# Patient Record
Sex: Female | Born: 1970
Health system: Southern US, Community
[De-identification: ages and names within clinical notes are randomized; demographics above are authoritative.]

## PROBLEM LIST (undated history)

## (undated) DIAGNOSIS — I10 Essential (primary) hypertension: Secondary | ICD-10-CM

## (undated) DIAGNOSIS — E119 Type 2 diabetes mellitus without complications: Secondary | ICD-10-CM

## (undated) HISTORY — PX: RHINOPLASTY: SUR1284

---

## 2001-01-10 ENCOUNTER — Other Ambulatory Visit: Admission: RE | Admit: 2001-01-10 | Discharge: 2001-01-10 | Payer: Self-pay | Admitting: Internal Medicine

## 2001-01-22 ENCOUNTER — Encounter: Payer: Self-pay | Admitting: Obstetrics and Gynecology

## 2001-01-22 ENCOUNTER — Ambulatory Visit (HOSPITAL_COMMUNITY): Admission: RE | Admit: 2001-01-22 | Discharge: 2001-01-22 | Payer: Self-pay | Admitting: Obstetrics and Gynecology

## 2001-05-22 ENCOUNTER — Encounter: Payer: Self-pay | Admitting: Obstetrics and Gynecology

## 2001-05-22 ENCOUNTER — Ambulatory Visit (HOSPITAL_COMMUNITY): Admission: RE | Admit: 2001-05-22 | Discharge: 2001-05-22 | Payer: Self-pay | Admitting: Obstetrics and Gynecology

## 2001-06-19 ENCOUNTER — Inpatient Hospital Stay (HOSPITAL_COMMUNITY): Admission: AD | Admit: 2001-06-19 | Discharge: 2001-06-21 | Payer: Self-pay | Admitting: Obstetrics and Gynecology

## 2002-01-29 ENCOUNTER — Other Ambulatory Visit: Admission: RE | Admit: 2002-01-29 | Discharge: 2002-01-29 | Payer: Self-pay | Admitting: Obstetrics and Gynecology

## 2002-07-16 ENCOUNTER — Encounter: Admission: RE | Admit: 2002-07-16 | Discharge: 2002-07-16 | Payer: Self-pay | Admitting: Specialist

## 2002-07-16 ENCOUNTER — Encounter: Payer: Self-pay | Admitting: Specialist

## 2003-02-17 ENCOUNTER — Other Ambulatory Visit: Admission: RE | Admit: 2003-02-17 | Discharge: 2003-02-17 | Payer: Self-pay | Admitting: Obstetrics and Gynecology

## 2004-03-01 ENCOUNTER — Other Ambulatory Visit: Admission: RE | Admit: 2004-03-01 | Discharge: 2004-03-01 | Payer: Self-pay | Admitting: Obstetrics and Gynecology

## 2004-03-04 ENCOUNTER — Other Ambulatory Visit: Admission: RE | Admit: 2004-03-04 | Discharge: 2004-03-04 | Payer: Self-pay | Admitting: Obstetrics and Gynecology

## 2008-12-03 ENCOUNTER — Inpatient Hospital Stay (HOSPITAL_COMMUNITY): Admission: EM | Admit: 2008-12-03 | Discharge: 2008-12-04 | Payer: Self-pay | Admitting: Emergency Medicine

## 2008-12-05 ENCOUNTER — Emergency Department (HOSPITAL_COMMUNITY): Admission: EM | Admit: 2008-12-05 | Discharge: 2008-12-05 | Payer: Self-pay | Admitting: Emergency Medicine

## 2009-01-05 ENCOUNTER — Encounter (INDEPENDENT_AMBULATORY_CARE_PROVIDER_SITE_OTHER): Payer: Self-pay | Admitting: *Deleted

## 2010-08-01 LAB — CK TOTAL AND CKMB (NOT AT ARMC)
CK, MB: 1 ng/mL (ref 0.3–4.0)
Relative Index: 0.7 (ref 0.0–2.5)

## 2010-08-01 LAB — URINALYSIS, ROUTINE W REFLEX MICROSCOPIC
Bilirubin Urine: NEGATIVE
Glucose, UA: >1000 mg/dL — AB
Hgb urine dipstick: NEGATIVE
Ketones, ur: NEGATIVE mg/dL
Leukocytes, UA: NEGATIVE
Nitrite: NEGATIVE
Protein, ur: NEGATIVE mg/dL
Specific Gravity, Urine: 1.014 (ref 1.005–1.030)
Urobilinogen, UA: 0.2 mg/dL (ref 0.0–1.0)
pH: 5 (ref 5.0–8.0)

## 2010-08-01 LAB — DIFFERENTIAL
Basophils Absolute: 0.1 10*3/uL (ref 0.0–0.1)
Basophils Relative: 0 % (ref 0–1)
Eosinophils Absolute: 0 10*3/uL (ref 0.0–0.7)
Eosinophils Relative: 0 % (ref 0–5)
Monocytes Absolute: 0.2 10*3/uL (ref 0.1–1.0)

## 2010-08-01 LAB — RAPID URINE DRUG SCREEN, HOSP PERFORMED
Amphetamines: NOT DETECTED
Barbiturates: NOT DETECTED
Benzodiazepines: NOT DETECTED
Cocaine: NOT DETECTED
Opiates: NOT DETECTED
Tetrahydrocannabinol: NOT DETECTED

## 2010-08-01 LAB — COMPREHENSIVE METABOLIC PANEL
AST: 32 U/L (ref 0–37)
Albumin: 4.1 g/dL (ref 3.5–5.2)
Alkaline Phosphatase: 51 U/L (ref 39–117)
Chloride: 105 mEq/L (ref 96–112)
GFR calc Af Amer: >60 mL/min (ref >60)
Potassium: 3.5 mEq/L (ref 3.5–5.1)
Total Bilirubin: 0.6 mg/dL (ref 0.3–1.2)

## 2010-08-01 LAB — CARDIAC PANEL(CRET KIN+CKTOT+MB+TROPI)
Relative Index: 0.4 (ref 0.0–2.5)
Total CK: 235 U/L — ABNORMAL HIGH (ref 7–177)
Troponin I: 0.01 ng/mL (ref 0.00–0.06)

## 2010-08-01 LAB — URINE MICROSCOPIC-ADD ON

## 2010-08-01 LAB — GLUCOSE, CAPILLARY
Glucose-Capillary: 109 mg/dL — ABNORMAL HIGH (ref 70–99)
Glucose-Capillary: 122 mg/dL — ABNORMAL HIGH (ref 70–99)
Glucose-Capillary: 215 mg/dL — ABNORMAL HIGH (ref 70–99)
Glucose-Capillary: 86 mg/dL (ref 70–99)

## 2010-08-01 LAB — BASIC METABOLIC PANEL
BUN: 15 mg/dL (ref 6–23)
Calcium: 8.1 mg/dL — ABNORMAL LOW (ref 8.4–10.5)
Creatinine, Ser: 0.54 mg/dL (ref 0.4–1.2)
GFR calc non Af Amer: >60 mL/min (ref >60)
Potassium: 3.5 mEq/L (ref 3.5–5.1)

## 2010-08-01 LAB — CBC
HCT: 38.3 % (ref 36.0–46.0)
Platelets: 197 10*3/uL (ref 150–400)
RBC: 3.86 MIL/uL — ABNORMAL LOW (ref 3.87–5.11)
WBC: 14.4 10*3/uL — ABNORMAL HIGH (ref 4.0–10.5)

## 2010-08-01 LAB — LIPID PANEL
Triglycerides: 50 mg/dL (ref <150)
VLDL: 10 mg/dL (ref 0–40)

## 2010-08-01 LAB — POCT CARDIAC MARKERS
CKMB, poc: <1 ng/mL — ABNORMAL LOW (ref 1.0–8.0)
Troponin i, poc: <0.05 ng/mL (ref 0.00–0.09)

## 2010-09-07 NOTE — Discharge Summary (Signed)
NAME:  NOYA, SANTARELLI NO.:  [id number]   MEDICAL RECORD NO.:  [id number]          PATIENT TYPE:  INP   LOCATION:  1423                         FACILITY:  [facility]   PHYSICIAN:  Ladell Pier, M.D.   DATE OF BIRTH:  1971/03/23   DATE OF ADMISSION:  12/03/2008  DATE OF DISCHARGE:  12/04/2008                               DISCHARGE SUMMARY   DISCHARGE DIAGNOSES:  1. Chest pain.  2. Tachycardia.  3. Diabetes.   DISCHARGE MEDICATIONS:  Metformin XL 500 mg daily.   FOLLOW-UP APPOINTMENTS:  The patient to follow up with one of our  cardiologists for a stress test.  They will call her with the  appointment.  The patient also given the number to follow up with Dr.  Lovell Sheehan, primary care physician, at  [phone].  The patient does not  have a primary care doctor.   PROCEDURE:  None.   DIAGNOSTIC STUDIES:  Chest X-Ray:  Showed no active disease.   CONSULTANTS:  Cardiology:  She was scheduled for outpatient test with  cardiology, no inpatient consult done.   HISTORY OF PRESENT ILLNESS:  The patient is a 40 year old female who  presented to the emergency room with chest pain that has been  intermittent for the past three days.  It was located in the mid-chest  and radiated to her back.  She also reported associated shortness of  breath with the chest pain.  Please see admission note for the remainder  of the history.   PAST MEDICAL HISTORY/FAMILY HISTORY/SOCIAL  HISTORY/MEDICATIONS/ALLERGIES/REVIEW OF SYSTEMS:  Per the admission  history and physical.   PHYSICAL EXAMINATION ON DISCHARGE:  VITAL SIGNS:  Temperature 97.7,  pulse of 61, respirations 16, blood pressure 109/73, pulse ox 99% on  room air.  GENERAL:  The patient is lying in bed.  Does not seem to be in any acute  distress.  HEENT: Normocephalic, atraumatic.  Pupils reactive to light without  erythema.  CARDIOVASCULAR:  Regular rhythm.  LUNGS:  Clear bilaterally.  ABDOMEN:  Positive bowel sounds.  EXTREMITIES:  No edema.   HOSPITAL COURSE:  #1 - Chest pain: - The patient was admitted to the  hospital.  She was placed on telemetry.  No events during her stay.  She  did have a chest x-ray and a D-dimer and cardiac markers done that were  all normal.  She is scheduled for a stress test with [facility] cardiology  outpatient.  She is also scheduled to follow up with Dr. Lovell Sheehan for  routine follow-up appointment.  #2 -  Diabetes:  The patient stated that she was diagnosed with diabetes  and was prescribed metformin, but she was not taking it.  She was given  a prescription for metformin.  Her blood sugars inpatient have been  running fairly routine, but it was elevated on admission.  #3 - Tachycardia:  When the patient came in she was tachycardiac.  Not  sure if her symptoms are related to just anxiety.  But her pulse today  is normal and 61 to 63.  Thyroid function is  normal.   DISCHARGE LABORATORY DATA:  Sodium 140, potassium to 3.5, chloride 112,  CO2 of 25, glucose 101, BUN 50, creatinine 0.54, calcium 8.1.  Cardiac  markers:  CK 199, MB 0.8 to 0.01.  TSH 1.48, total cholesterol 146, HDL  50, LDL 80, D-dimer less than 9.52. AST 32, ALT 18, alkaline phosphatase  51.      Ladell Pier, M.D.  Electronically Signed     NJ/MEDQ  D:  12/04/2008  T:  12/04/2008  Job:  [id number]   cc:   [facility]

## 2010-09-07 NOTE — H&P (Signed)
NAME:  Maria [name], Maria [name] NO.:  [id number]   MEDICAL RECORD NO.:  [id number]          PATIENT TYPE:  INP   LOCATION:  0101                         [facility]ACILITY:  [facility]   PHYSICIAN:  Della Goo, M.D. DATE OF BIRTH:  [birth year]   DATE OF ADMISSION:  12/03/2008  DATE OF DISCHARGE:                              HISTORY & PHYSICAL   PRIMARY CARE PHYSICIAN:  None.   CHIEF COMPLAINT:  Chest pain.   HISTORY OF PRESENT ILLNESS:  This is a 40 year old female who presents  to the emergency department with complaints of severe chest pain which  has been intermittent occurring for the past 3 days.  She states that  the pain is located in the midchest area and radiates into the back.  She also reports having associated symptoms of shortness of breath.  Denies having any nausea, vomiting, diaphoresis, but does report having  palpitations.  She reports beginning to have tightness in her hands this  evening.  She also reports having shortness of breath.  She denies  having any congestion, fevers, chills.  The patient also reports having  increased thirst which she has had for months and also increased  urination.   In the emergency department, the patient was evaluated and her  laboratory studies returned revealing hyperglycemia.  Also she was found  to have tachycardia and her EKG revealed a sinus tachycardia with a rate  ranging from 128-130.  The patient also had a urinalysis performed which  did reveal glycosuria.  Glucose level in the urine was greater than  1000.  Urine ketones were negative.  When asked specifically about  symptoms of polydipsia and polyuria, the patient reports having this for  the past few months.  She also states that she had been seen at an  urgent care center and had been placed on a medication for elevated  blood sugar.  However, she chose not to take this medication.   PAST MEDICAL HISTORY:  Otherwise none.   PAST SURGICAL HISTORY:  None.   MEDICATIONS:  None.   ALLERGIES:  No known drug allergies.   SOCIAL HISTORY:  The patient is a nonsmoker, nondrinker.  No history of  illicit drug usage.   FAMILY HISTORY:  Positive for coronary artery disease in her mother  positive for hypertension in both parents.  Positive cerebrovascular  accident in her father.  He died of a stroke.  No history of diabetes or  cancer in the family that she knows of.   REVIEW OF SYSTEMS:  Pertinents mentioned above.   PHYSICAL EXAMINATION:  FINDINGS:  This is a 40 year old thin, well-  nourished, well-developed female in mild distress.  VITAL SIGNS: Temperature 98.0, blood pressure 124/86, heart rate 131,  respirations 18, O2 saturation 99% in room air.  HEENT EXAMINATION: Normocephalic, atraumatic.  Pupils equally round,  reactive to light.  Extraocular movements are intact. Funduscopic  benign.  There is no scleral icterus.  Nares are patent bilaterally.  Oropharynx is clear.  NECK:  Supple, full range of motion.  No thyromegaly, adenopathy or  jugular  venous distention.  CARDIOVASCULAR:  Tachycardiac rate and rhythm.  No murmurs, gallops or  rubs.  LUNGS: Clear to auscultation bilaterally.  ABDOMEN: Positive bowel sounds, soft, nontender, nondistended.  EXTREMITIES: Without cyanosis, clubbing or edema.  NEUROLOGIC EXAMINATION:  The patient is alert and oriented x3.  There  are no focal deficits on examination.   LABORATORY STUDIES:  White blood cell count 14.4, hemoglobin 12.4,  hematocrit 38.3, platelets 197, neutrophils 92%, lymphocytes 6%.  Sodium  134, potassium 3.5, chloride 105, carbon dioxide 21, BUN 10, creatinine  0.75, glucose 210.  Calcium level 9.5, AST 32, ALT 18.  Urinalysis as  mentioned above with urine glucose greater than 1000.  Urine ketones  negative.  Leukocyte esterase negative, urine nitrites negative, urine  drug screen negative.  Urine HCG negative.  Point of care cardiac  markers with a myoglobin of 251, CK-MB  less than 1.0 and troponin less  than 0.05, D-dimer less than 0.22.  EKG reveals a sinus tachycardia.   ASSESSMENT:  A 40 year old female being admitted with:  1. Chest pain number  2. Sinus tachycardia.  3. Hyperglycemia.  4. Anxiety.   PLAN:  The patient will be admitted to telemetry area for cardiac  monitoring.  Cardiac enzymes will be performed.  The patient has been  placed on IV fluids and sliding scale insulin coverage for elevated  blood sugars..  Metformin therapy will be initiated in the a.m. at a low  dose and this will be adjusted as needed.  The patient will be placed on  a carbohydrate modified medium diet.  Fasting lipids will also be  checked.  Cardiac enzymes will be performed.  However, this is a sinus  tachycardia and causes of the sinus tachycardia will be further  investigated in this patient.  The patient is extremely anxious and  p.r.n. anxiolytic therapy has been ordered as well.  The patient will be  placed on DVT and GI prophylaxis in the interim.  The patient will also  be placed on IV fluids for rehydration therapy as well.  Nitroglycerin  paste has been ordered until the patient rules out.      Della Goo, M.D.  Electronically Signed     HJ/MEDQ  D:  12/03/2008  T:  12/03/2008  Job:  [id number]

## 2010-09-10 NOTE — Discharge Summary (Signed)
[facility]  Patient:    Maria [name], Maria [name] Visit Number: [id number] MRN: [id number]          Service Type: ANT Location: [facility] Attending Physician:  Malon Kindle Dictated by:   Zenaida Niece, M.D. Adm. Date:  06/19/01 Disc. Date: 06/21/01                             Discharge Summary  ADMISSION DIAGNOSIS:          Intrauterine pregnancy at 39 weeks.  DISCHARGE DIAGNOSIS:          Intrauterine pregnancy at 39 weeks.  PROCEDURES:                   Spontaneous vaginal delivery.  COMPLICATIONS:                None.  CONSULTATIONS:                None.  HISTORY AND PHYSICAL:         This is a 40 year old [place] female, gravida 3, para 1-0-1-1, with an EGA of [redacted] weeks by an LMP consistent with a 19-week ultrasound with a due date of February 27 who presents for induction of a favorable cervix. She has had some spotting, no rupture of membranes, occasional contractions, and good fetal movement. Prenatal care was essentially uncomplicated.  PRENATAL LABORATORY DATA:     Blood type is O positive with negative antibody screen, RPR nonreactive, rubella equivocal, hepatitis B surface antigen negative, HIV negative, gonorrhea and Chlamydia negative, triple screen normal, glucola 99, group B strep is negative.  PAST OBSTETRICAL HISTORY:     In 40 in [place] she had a vaginal delivery at 40 weeks, 6 pounds, no complications. In 1998, spontaneous abortion. The remainder of her history is negative.  PHYSICAL EXAMINATION:  VITAL SIGNS:                  Afebrile with stable vital signs.  ELECTRONIC FETAL MONITOR:     Fetal heart rate tracing reactive with rare contractions.  ABDOMEN:                      Abdomen gravid nontender with an estimated fetal weight of 7 pounds.  PELVIC:                       Vaginal exam was 3-4/50/-1/vertex presentation, adequate pelvis, and amniotomy reveals clear fluid.  HOSPITAL COURSE:              The  patient progressed into active labor without Pitocin and reached complete and pushed well. On the morning of February 25, she had a vaginal delivery of a viable female infant with Apgars of 8 and 8 that weighed 7 pounds 3 ounces through a nuchal cord. Placenta delivered spontaneous and was intact and did have a three-vessel cord. She had a second degree laceration repaired with 3-0 Vicryl with a local block and estimated blood loss was less than 500 cc.  Postpartum, she did very well, remained afebrile, breast fed her baby without complications, and on the morning of postpartum day #2 was stable for discharge home.  CONDITION ON DISCHARGE:       Stable.  DISPOSITION:                  Discharge to home.  DISCHARGE INSTRUCTIONS:  Regular diet. Activity: Pelvic rest. She is given our discharge pamphlet.  DISCHARGE FOLLOWUP:           The patient is to follow up in four to six weeks.  DISCHARGE MEDICATIONS:        Over-the-counter Motrin p.r.n. Dictated by:   Zenaida Niece, M.D. Attending Physician:  Malon Kindle DD:  06/21/01 TD:  06/21/01 Job: [id number]

## 2012-08-27 ENCOUNTER — Ambulatory Visit (INDEPENDENT_AMBULATORY_CARE_PROVIDER_SITE_OTHER): Payer: BC Managed Care – HMO | Admitting: Family Medicine

## 2012-08-27 VITALS — BP 102/60 | HR 65 | Temp 97.9°F | Resp 16 | Ht 62.5 in | Wt 121.8 lb

## 2012-08-27 DIAGNOSIS — Z23 Encounter for immunization: Secondary | ICD-10-CM

## 2012-08-27 DIAGNOSIS — Z0000 Encounter for general adult medical examination without abnormal findings: Secondary | ICD-10-CM

## 2012-08-27 LAB — COMPREHENSIVE METABOLIC PANEL
ALT: 18 U/L (ref 0–35)
Albumin: 4.2 g/dL (ref 3.5–5.2)
CO2: 25 mEq/L (ref 19–32)
Chloride: 108 mEq/L (ref 96–112)
Glucose, Bld: 86 mg/dL (ref 70–99)
Potassium: 4.4 mEq/L (ref 3.5–5.3)
Sodium: 140 mEq/L (ref 135–145)
Total Protein: 7.2 g/dL (ref 6.0–8.3)

## 2012-08-27 LAB — POCT CBC
Granulocyte percent: 77.4 %G (ref 37–80)
HCT, POC: 37.2 % — AB (ref 37.7–47.9)
Hemoglobin: 11.4 g/dL — AB (ref 12.2–16.2)
Lymph, poc: 1.5 (ref 0.6–3.4)
POC Granulocyte: 6.6 (ref 2–6.9)
RBC: 3.82 M/uL — AB (ref 4.04–5.48)

## 2012-08-27 LAB — LIPID PANEL
Cholesterol: 169 mg/dL (ref 0–200)
Triglycerides: 72 mg/dL (ref <150)

## 2012-08-27 NOTE — Progress Notes (Signed)
[facility] [address] [place] [address] [phone]  Date:  08/27/2012   Name:  Maria [name]   DOB:  Oct 16, 1970   MRN:  [id number]  PCP:  No primary provider on file.    Chief Complaint: Annual Exam   History of Present Illness:  Maria [name] is a 41 y.o. very pleasant female patient who presents with the following:  Here today for a CPE.   She has noted some runny nose and sneezing for the last couple of months, and she sometimes has blood in her nasal discharge.   She has her menses right now- just started today.   She did eat some breakfast this morning.    She would like to do a pap smear today.  She has not yet had a mammogram performed.   She last had immunizations performed in 2002 prior to emigration to the [place].  She is from [place], here today with her female partner/ husband.  They do not use contraception.  They have 2 children, ages 34 and 34 years old.  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   History reviewed. No pertinent past medical history.  History reviewed. No pertinent past surgical history.  History  Substance Use Topics  . Smoking status: Never Smoker   . Smokeless tobacco: Not on file  . Alcohol Use: No    History reviewed. No pertinent family history.  No Known Allergies  Medication list has been reviewed and updated.  No current outpatient prescriptions on file prior to visit.   No current facility-administered medications on file prior to visit.    Review of Systems:  As per HPI- otherwise negative.   Physical Examination: Filed Vitals:   08/27/12 0951  BP: 102/60  Pulse: 65  Temp: 97.9 F (36.6 C)  Resp: 16   Filed Vitals:   08/27/12 0951  Height: 5' 2.5" (1.588 m)  Weight: 121 lb 12.8 oz (55.248 kg)   Body mass index is 21.91 kg/(m^2). Ideal Body Weight: Weight in (lb) to have BMI = 25: 138.6  GEN: WDWN, NAD,  Non-toxic, A & O x 3 HEENT: Atraumatic, Normocephalic. Neck supple. No masses, No LAD.  Bilateral TM wnl, oropharynx normal.  PEERL,EOMI.   Ears and Nose: No external deformity. CV: RRR, No M/G/R. No JVD. No thrill. No extra heart sounds. PULM: CTA B, no wheezes, crackles, rhonchi. No retractions. No resp. distress. No accessory muscle use. ABD: S, NT, ND, +BS. No rebound. No HSM. EXTR: No c/c/e NEURO Normal gait.  PSYCH: Normally interactive. Conversant. Not depressed or anxious appearing.  Calm demeanor.  GU: normal exam, no discharge, masses, or CMT.  Normal vagina, cervix and uterus Breast: normal exam, no masses, discharge or dimpling.    Results for orders placed in visit on 08/27/12  POCT CBC      Result Value Range   WBC 8.5  4.6 - 10.2 K/uL   Lymph, poc 1.5  0.6 - 3.4   POC LYMPH PERCENT 17.5  10 - 50 %L   MID (cbc) 0.4  0 - 0.9   POC MID % 5.1  0 - 12 %M   POC Granulocyte 6.6  2 - 6.9   Granulocyte percent 77.4  37 - 80 %G   RBC 3.82 (*) 4.04 - 5.48 M/uL   Hemoglobin 11.4 (*) 12.2 - 16.2  g/dL   HCT, POC 16.1 (*) 09.6 - 47.9 %   MCV 97.5 (*) 80 - 97 fL   MCH, POC 29.8  27 - 31.2 pg   MCHC 30.6 (*) 31.8 - 35.4 g/dL   RDW, POC 04.5     Platelet Count, POC 203  142 - 424 K/uL   MPV 9.1  0 - 99.8 fL     Assessment and Plan: Physical exam, annual - Plan: POCT CBC, Comprehensive metabolic panel, Lipid panel, TSH, Pap IG w/ reflex to HPV when ASC-U, Tdap vaccine greater than or equal to 7yo IM  CPE today- healthy, await her labs and follow- up.  Tdap today.  Will plan further follow- up pending labs. Encouraged mammogram- see pt instructions  Signed Abbe Amsterdam, MD

## 2012-08-27 NOTE — Patient Instructions (Addendum)
Please schedule a mammogram- screening test for breast cancer.  This can be done at the [facility], or at the [facility].  Please call and schedule this test at your convenience    I will be in touch with your labs as soon as they come in.    For your allergies try some OTC claritin or zyrtec.  If this is not helpful please let me know.

## 2012-08-28 ENCOUNTER — Encounter: Payer: Self-pay | Admitting: Family Medicine

## 2012-08-28 LAB — PAP IG W/ RFLX HPV ASCU

## 2012-08-28 NOTE — Addendum Note (Signed)
Addended by: Abbe Amsterdam C on: 08/28/2012 03:21 PM   Modules accepted: Orders

## 2013-10-07 ENCOUNTER — Ambulatory Visit (INDEPENDENT_AMBULATORY_CARE_PROVIDER_SITE_OTHER): Payer: No Typology Code available for payment source | Admitting: Family Medicine

## 2013-10-07 VITALS — BP 106/54 | HR 65 | Temp 98.2°F | Resp 16 | Ht 62.0 in | Wt 114.4 lb

## 2013-10-07 DIAGNOSIS — R7309 Other abnormal glucose: Secondary | ICD-10-CM

## 2013-10-07 DIAGNOSIS — Z0000 Encounter for general adult medical examination without abnormal findings: Secondary | ICD-10-CM

## 2013-10-07 DIAGNOSIS — R739 Hyperglycemia, unspecified: Secondary | ICD-10-CM

## 2013-10-07 DIAGNOSIS — Z1231 Encounter for screening mammogram for malignant neoplasm of breast: Secondary | ICD-10-CM

## 2013-10-07 DIAGNOSIS — Z124 Encounter for screening for malignant neoplasm of cervix: Secondary | ICD-10-CM

## 2013-10-07 LAB — POCT URINALYSIS DIPSTICK
Bilirubin, UA: NEGATIVE
Blood, UA: NEGATIVE
Glucose, UA: NEGATIVE
Ketones, UA: NEGATIVE
Leukocytes, UA: NEGATIVE
Nitrite, UA: NEGATIVE
Protein, UA: NEGATIVE
Spec Grav, UA: 1.02
Urobilinogen, UA: 0.2
pH, UA: 5.5

## 2013-10-07 LAB — POCT CBC
Granulocyte percent: 65.1 %G (ref 37–80)
HCT, POC: 37.2 % — AB (ref 37.7–47.9)
Hemoglobin: 11.7 g/dL — AB (ref 12.2–16.2)
Lymph, poc: 1.9 (ref 0.6–3.4)
MCH, POC: 31.4 pg — AB (ref 27–31.2)
MCHC: 31.5 g/dL — AB (ref 31.8–35.4)
MCV: 99.8 fL — AB (ref 80–97)
MID (cbc): 0.3 (ref 0–0.9)
MPV: 8.6 fL (ref 0–99.8)
POC Granulocyte: 4.1 (ref 2–6.9)
POC LYMPH PERCENT: 30 %L (ref 10–50)
POC MID %: 4.9 % (ref 0–12)
Platelet Count, POC: 197 10*3/uL (ref 142–424)
RBC: 3.73 M/uL — AB (ref 4.04–5.48)
RDW, POC: 12.8 %
WBC: 6.3 10*3/uL (ref 4.6–10.2)

## 2013-10-07 LAB — COMPREHENSIVE METABOLIC PANEL
Albumin: 4.2 g/dL (ref 3.5–5.2)
Alkaline Phosphatase: 47 U/L (ref 39–117)
BUN: 18 mg/dL (ref 6–23)
CO2: 26 mEq/L (ref 19–32)
Calcium: 9.1 mg/dL (ref 8.4–10.5)
Glucose, Bld: 87 mg/dL (ref 70–99)
Potassium: 3.9 mEq/L (ref 3.5–5.3)
Total Protein: 7.3 g/dL (ref 6.0–8.3)

## 2013-10-07 LAB — COMPREHENSIVE METABOLIC PANEL WITH GFR
ALT: 12 U/L (ref 0–35)
AST: 15 U/L (ref 0–37)
Chloride: 106 meq/L (ref 96–112)
Creat: 0.79 mg/dL (ref 0.50–1.10)
Sodium: 139 meq/L (ref 135–145)
Total Bilirubin: 0.6 mg/dL (ref 0.2–1.2)

## 2013-10-07 LAB — POCT UA - MICROSCOPIC ONLY
Casts, Ur, LPF, POC: NEGATIVE
Crystals, Ur, HPF, POC: NEGATIVE
Mucus, UA: NEGATIVE
RBC, urine, microscopic: NEGATIVE
WBC, Ur, HPF, POC: NEGATIVE
Yeast, UA: NEGATIVE

## 2013-10-07 LAB — LDL CHOLESTEROL, DIRECT: Direct LDL: 82 mg/dL

## 2013-10-07 LAB — POCT GLYCOSYLATED HEMOGLOBIN (HGB A1C): Hemoglobin A1C: 5.1

## 2013-10-07 NOTE — Progress Notes (Signed)
Chief Complaint:  Chief Complaint  Patient presents with  . Annual Exam    HPI: Maria [name] is a 43 y.o. 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 No pertinent past medical history. Past Surgical History  Procedure Laterality Date  . Rhinoplasty     History   Social History  . Marital Status: Single    Spouse Name: N/A    Number of Children: N/A  . Years of Education: N/A   Social History Main Topics  . Smoking status: Never Smoker   . Smokeless tobacco: None  . Alcohol Use: No  . Drug Use: No  . Sexual Activity: None   Other Topics Concern  . None   Social History Narrative  . None   History reviewed. No pertinent family history. No Known Allergies Prior to Admission medications   Not on File     ROS: The patient denies fevers, chills, night sweats, unintentional weight loss, chest pain, palpitations, wheezing, dyspnea on exertion, nausea, vomiting, abdominal pain, dysuria, hematuria, melena, numbness, weakness, or tingling.   All other systems have been reviewed and were otherwise negative with the exception of those mentioned in the HPI and as above.    PHYSICAL EXAM: Filed Vitals:   10/07/13 1255  BP: 106/54  Pulse: 65  Temp: 98.2 F (36.8 C)  Resp: 16   Filed Vitals:   10/07/13 1255  Height: 5\' 2"  (1.575 m)  Weight: 114 lb 6.4 oz (51.891 kg)   Body mass index is 20.92 kg/(m^2).  General: Alert, no acute distress HEENT:  Normocephalic, atraumatic, oropharynx patent. EOMI, PERRLA, TM nl Cardiovascular:  Regular rate and rhythm, no rubs murmurs or  gallops.  No Carotid bruits, radial pulse intact. No pedal edema.  Respiratory: Clear to auscultation bilaterally.  No wheezes, rales, or rhonchi.  No cyanosis, no use of accessory musculature GI: No organomegaly, abdomen is soft and non-tender, positive bowel sounds.  No masses. Skin: No rashes. Neurologic: Facial musculature symmetric. Psychiatric: Patient is appropriate throughout our interaction. Lymphatic: No cervical lymphadenopathy Musculoskeletal: Gait intact. GU-normal, cervix nl BReast normal exam  LABS: Results for orders placed in visit on 10/07/13  COMPREHENSIVE METABOLIC PANEL      Result Value Ref Range   Sodium 139  135 - 145 mEq/L   Potassium 3.9  3.5 - 5.3 mEq/L   Chloride 106  96 - 112 mEq/L   CO2 26  19 - 32 mEq/L   Glucose, Bld 87  70 - 99 mg/dL   BUN 18  6 - 23 mg/dL   Creat 0.79  0.50 - 1.10 mg/dL   Total Bilirubin 0.6  0.2 - 1.2 mg/dL   Alkaline Phosphatase 47  39 - 117 U/L   AST 15  0 - 37 U/L   ALT 12  0 - 35 U/L   Total Protein 7.3  6.0 - 8.3 g/dL   Albumin 4.2  3.5 - 5.2 g/dL   Calcium 9.1  8.4 - 10.5 mg/dL  TSH  Result Value Ref Range   TSH 0.981  0.350 - 4.500 uIU/mL  LDL CHOLESTEROL, DIRECT      Result Value Ref Range   Direct LDL 82    POCT CBC      Result Value Ref Range   WBC 6.3  4.6 - 10.2 K/uL   Lymph, poc 1.9  0.6 - 3.4   POC LYMPH PERCENT 30.0  10 - 50 %L   MID (cbc) 0.3  0 - 0.9   POC MID % 4.9  0 - 12 %M   POC Granulocyte 4.1  2 - 6.9   Granulocyte percent 65.1  37 - 80 %G   RBC 3.73 (*) 4.04 - 5.48 M/uL   Hemoglobin 11.7 (*) 12.2 - 16.2 g/dL   HCT, POC 37.2 (*) 37.7 - 47.9 %   MCV 99.8 (*) 80 - 97 fL   MCH, POC 31.4 (*) 27 - 31.2 pg   MCHC 31.5 (*) 31.8 - 35.4 g/dL   RDW, POC 12.8     Platelet Count, POC 197  142 - 424 K/uL   MPV 8.6  0 - 99.8 fL  POCT GLYCOSYLATED HEMOGLOBIN (HGB A1C)      Result Value Ref Range   Hemoglobin A1C 5.1    POCT URINALYSIS DIPSTICK      Result Value Ref Range   Color, UA yellow      Clarity, UA clear     Glucose, UA neg     Bilirubin, UA neg     Ketones, UA neg     Spec Grav, UA 1.020     Blood, UA neg     pH, UA 5.5     Protein, UA neg     Urobilinogen, UA 0.2     Nitrite, UA neg     Leukocytes, UA Negative    POCT UA - MICROSCOPIC ONLY      Result Value Ref Range   WBC, Ur, HPF, POC neg     RBC, urine, microscopic neg     Bacteria, U Microscopic trace     Mucus, UA neg     Epithelial cells, urine per micros 1-4     Crystals, Ur, HPF, POC neg     Casts, Ur, LPF, POC neg     Yeast, UA neg    PAP IG W/ RFLX HPV ASCU      Result Value Ref Range   Specimen adequacy: SEE NOTE     FINAL DIAGNOSIS: SEE NOTE     COMMENTS: SEE NOTE     Cytotechnologist: SEE NOTE       EKG/XRAY:   Primary read interpreted by [facility]. Marin Comment at [facility].   ASSESSMENT/PLAN: Encounter Diagnoses  Name Primary?  . Annual physical exam Yes  . Other screening mammogram   . Pap smear for cervical cancer screening   . Hyperglycemia    43 y/o [place]  female who is her for annual with pap Unfortunately she told me she had a pap 3 years ago and since epic was very slow and I was not able to pull her chart up in a timely fashion, I went ahead and did a pap  ( she had a pap in 2014)  Annual labs pending Schedule patient for screening mammogram F/u in 1 year  Gross sideeffects, risk and benefits, and alternatives of medications d/w patient. Patient is aware that all medications have potential sideeffects and we are unable to predict every sideeffect or drug-drug interaction that may  occur.  LE, [place], DO 10/09/2013 11:35 AM

## 2013-10-08 LAB — PAP IG W/ RFLX HPV ASCU

## 2013-10-08 LAB — TSH: TSH: 0.981 u[IU]/mL (ref 0.350–4.500)

## 2013-10-09 ENCOUNTER — Encounter: Payer: Self-pay | Admitting: Family Medicine

## 2013-11-04 ENCOUNTER — Ambulatory Visit
Admission: RE | Admit: 2013-11-04 | Discharge: 2013-11-04 | Disposition: A | Discharge status: Home / Self Care | Payer: No Typology Code available for payment source | Source: Ambulatory Visit | Attending: Family Medicine | Admitting: Family Medicine

## 2013-11-04 DIAGNOSIS — Z1231 Encounter for screening mammogram for malignant neoplasm of breast: Secondary | ICD-10-CM

## 2014-06-10 ENCOUNTER — Ambulatory Visit (INDEPENDENT_AMBULATORY_CARE_PROVIDER_SITE_OTHER): Payer: 59 | Admitting: Emergency Medicine

## 2014-06-10 ENCOUNTER — Encounter: Payer: Self-pay | Admitting: Family Medicine

## 2014-06-10 VITALS — BP 112/62 | HR 67 | Temp 98.8°F | Resp 16 | Ht 62.0 in | Wt 112.0 lb

## 2014-06-10 DIAGNOSIS — H00021 Hordeolum internum right upper eyelid: Secondary | ICD-10-CM

## 2014-06-10 NOTE — Patient Instructions (Signed)
Continue artificial tears for comfort Use warm compresses to the eyelid 4-5 times a day Please come back in if you are not better in 2-3 days or you have changes in your vision Do not wear make up until it is resolved Wash face twice a day with a gentle soap.   Sty A sty (hordeolum) is an infection of a gland in the eyelid located at the base of the eyelash. A sty may develop a white or yellow head of pus. It can be puffy (swollen). Usually, the sty will burst and pus will come out on its own. They do not leave lumps in the eyelid once they drain. A sty is often confused with another form of cyst of the eyelid called a chalazion. Chalazions occur within the eyelid and not on the edge where the bases of the eyelashes are. They often are red, sore and then form firm lumps in the eyelid. CAUSES   Germs (bacteria).  Lasting (chronic) eyelid inflammation. SYMPTOMS   Tenderness, redness and swelling along the edge of the eyelid at the base of the eyelashes.  Sometimes, there is a white or yellow head of pus. It may or may not drain. DIAGNOSIS  An ophthalmologist will be able to distinguish between a sty and a chalazion and treat the condition appropriately.  TREATMENT   Styes are typically treated with warm packs (compresses) until drainage occurs.  In rare cases, medicines that kill germs (antibiotics) may be prescribed. These antibiotics may be in the form of drops, cream or pills.  If a hard lump has formed, it is generally necessary to do a small incision and remove the hardened contents of the cyst in a minor surgical procedure done in the office.  In suspicious cases, your caregiver may send the contents of the cyst to the lab to be certain that it is not a rare, but dangerous form of cancer of the glands of the eyelid. HOME CARE INSTRUCTIONS   Wash your hands often and dry them with a clean towel. Avoid touching your eyelid. This may spread the infection to other parts of the  eye.  Apply heat to your eyelid for 10 to 20 minutes, several times a day, to ease pain and help to heal it faster.  Do not squeeze the sty. Allow it to drain on its own. Wash your eyelid carefully 3 to 4 times per day to remove any pus. SEEK IMMEDIATE MEDICAL CARE IF:   Your eye becomes painful or puffy (swollen).  Your vision changes.  Your sty does not drain by itself within 3 days.  Your sty comes back within a short period of time, even with treatment.  You have redness (inflammation) around the eye.  You have a fever. Document Released: 01/19/2005 Document Revised: 07/04/2011 Document Reviewed: 07/26/2013 [facility] Patient Information 2015 [place], [place]. This information is not intended to replace advice given to you by your health care provider. Make sure you discuss any questions you have with your health care provider.

## 2014-06-10 NOTE — Progress Notes (Signed)
   Subjective:    Patient ID: Maria [name], female    DOB: 1971-01-20, 44 y.o.   MRN: [id number]  HPI This is a very pleasant 44 yo female who is accompanied by her son, who helps with translation. The patient presents today with 4 day history of right eye lid swelling and pain. Pain only when she is looking around.No foreign body sensation.Felt itchy at first, not now. No blurred vision or floaters, No drainage. No pain at night. No matting or crusting. Some headache.  Used some artificial tears which helped a little. She does not wear contacts. She denies any trauma to her eye. She wears glasses and has had an eye exam within the last year. She has not used any new products on her face. She applied eye makeup yesterday.   No past medical history on file. Past Surgical History  Procedure Laterality Date  . Rhinoplasty     No family history on file. History  Substance Use Topics  . Smoking status: Never Smoker   . Smokeless tobacco: Not on file  . Alcohol Use: No   Review of Systems No fever, some sneezing, runny nose, no cough, no sore throat.     Objective:   Physical Exam  Constitutional: She is oriented to person, place, and time. She appears well-developed and well-nourished.  HENT:  Head: Normocephalic and atraumatic.  Eyes: Conjunctivae and EOM are normal. Pupils are equal, round, and reactive to light. Right eye exhibits hordeolum. Right eye exhibits no chemosis, no discharge and no exudate. No foreign body present in the right eye. Left eye exhibits no chemosis, no discharge, no exudate and no hordeolum. No foreign body present in the left eye. No scleral icterus.  Right upper lid swollen. Tender with erythema along lash line medially. Nml funduscopic exam.   Cardiovascular: Normal rate.   Pulmonary/Chest: Effort normal.  Musculoskeletal: Normal range of motion.  Neurological: She is alert and oriented to person, place, and time.  Skin: Skin is warm and dry.  Psychiatric: She  has a normal mood and affect. Her behavior is normal. Judgment and thought content normal.  Vitals reviewed.  BP 112/62 mmHg  Pulse 67  Temp(Src) 98.8 F (37.1 C) (Oral)  Resp 16  Ht 5\' 2"  (1.575 m)  Wt 112 lb (50.803 kg)  BMI 20.48 kg/m2  SpO2 99%  LMP 06/06/2014     Assessment & Plan:  1. Hordeolum internum of right upper eyelid Provided written and verbal information regarding diagnosis and treatment. Warm compresses several times a day, wash face including eyelid with mild soap twice a day, avoid cosmetics until improved. RTC if worsening symptoms, visual changes, purulent drainage.   Elby Beck, FNP-BC  [facility]  06/10/2014 4:14 PM

## 2015-01-25 ENCOUNTER — Other Ambulatory Visit: Payer: Self-pay | Admitting: Family Medicine

## 2015-01-25 ENCOUNTER — Ambulatory Visit (INDEPENDENT_AMBULATORY_CARE_PROVIDER_SITE_OTHER): Payer: 59 | Admitting: Family Medicine

## 2015-01-25 VITALS — BP 108/70 | HR 70 | Temp 98.9°F | Resp 18 | Ht 62.5 in | Wt 115.0 lb

## 2015-01-25 DIAGNOSIS — Z01419 Encounter for gynecological examination (general) (routine) without abnormal findings: Secondary | ICD-10-CM

## 2015-01-25 DIAGNOSIS — E041 Nontoxic single thyroid nodule: Secondary | ICD-10-CM

## 2015-01-25 DIAGNOSIS — Z1231 Encounter for screening mammogram for malignant neoplasm of breast: Secondary | ICD-10-CM | POA: Diagnosis not present

## 2015-01-25 DIAGNOSIS — Z13220 Encounter for screening for lipoid disorders: Secondary | ICD-10-CM

## 2015-01-25 DIAGNOSIS — Z114 Encounter for screening for human immunodeficiency virus [HIV]: Secondary | ICD-10-CM

## 2015-01-25 DIAGNOSIS — E049 Nontoxic goiter, unspecified: Secondary | ICD-10-CM | POA: Diagnosis not present

## 2015-01-25 DIAGNOSIS — Z0000 Encounter for general adult medical examination without abnormal findings: Secondary | ICD-10-CM | POA: Diagnosis not present

## 2015-01-25 DIAGNOSIS — Z131 Encounter for screening for diabetes mellitus: Secondary | ICD-10-CM | POA: Diagnosis not present

## 2015-01-25 DIAGNOSIS — D649 Anemia, unspecified: Secondary | ICD-10-CM

## 2015-01-25 LAB — LIPID PANEL
CHOLESTEROL: 196 mg/dL (ref 125–200)
HDL: 80 mg/dL (ref >=46)
LDL Cholesterol: 104 mg/dL (ref <130)
TRIGLYCERIDES: 62 mg/dL (ref <150)
Total CHOL/HDL Ratio: 2.5 Ratio (ref <=5.0)
VLDL: 12 mg/dL (ref <30)

## 2015-01-25 LAB — POCT URINALYSIS DIP (MANUAL ENTRY)
Bilirubin, UA: NEGATIVE
GLUCOSE UA: NEGATIVE
Ketones, POC UA: NEGATIVE
NITRITE UA: NEGATIVE
PROTEIN UA: NEGATIVE
SPEC GRAV UA: 1.01
UROBILINOGEN UA: 0.2
pH, UA: 7

## 2015-01-25 LAB — CBC WITH DIFFERENTIAL/PLATELET
BASOS ABS: 0 10*3/uL (ref 0.0–0.1)
Basophils Relative: 0 % (ref 0–1)
EOS PCT: 1 % (ref 0–5)
Eosinophils Absolute: 0.1 10*3/uL (ref 0.0–0.7)
HEMATOCRIT: 38.2 % (ref 36.0–46.0)
HEMOGLOBIN: 12.7 g/dL (ref 12.0–15.0)
LYMPHS ABS: 1.6 10*3/uL (ref 0.7–4.0)
LYMPHS PCT: 30 % (ref 12–46)
MCH: 31.8 pg (ref 26.0–34.0)
MCHC: 33.2 g/dL (ref 30.0–36.0)
MCV: 95.7 fL (ref 78.0–100.0)
MPV: 9.6 fL (ref 8.6–12.4)
Monocytes Absolute: 0.4 10*3/uL (ref 0.1–1.0)
Monocytes Relative: 8 % (ref 3–12)
NEUTROS ABS: 3.2 10*3/uL (ref 1.7–7.7)
Neutrophils Relative %: 61 % (ref 43–77)
Platelets: 233 10*3/uL (ref 150–400)
RBC: 3.99 MIL/uL (ref 3.87–5.11)
RDW: 13.1 % (ref 11.5–15.5)
WBC: 5.3 10*3/uL (ref 4.0–10.5)

## 2015-01-25 LAB — COMPREHENSIVE METABOLIC PANEL
ALBUMIN: 4.4 g/dL (ref 3.6–5.1)
ALK PHOS: 48 U/L (ref 33–115)
ALT: 12 U/L (ref 6–29)
AST: 15 U/L (ref 10–30)
BILIRUBIN TOTAL: 0.9 mg/dL (ref 0.2–1.2)
BUN: 13 mg/dL (ref 7–25)
CALCIUM: 9.1 mg/dL (ref 8.6–10.2)
CO2: 26 mmol/L (ref 20–31)
Chloride: 108 mmol/L (ref 98–110)
Creat: 0.65 mg/dL (ref 0.50–1.10)
GLUCOSE: 82 mg/dL (ref 65–99)
POTASSIUM: 3.8 mmol/L (ref 3.5–5.3)
Sodium: 140 mmol/L (ref 135–146)
Total Protein: 7.6 g/dL (ref 6.1–8.1)

## 2015-01-25 LAB — T4, FREE: FREE T4: 0.97 ng/dL (ref 0.80–1.80)

## 2015-01-25 LAB — HIV ANTIBODY (ROUTINE TESTING W REFLEX): HIV 1&2 Ab, 4th Generation: NONREACTIVE

## 2015-01-25 NOTE — Progress Notes (Signed)
Subjective:    Patient ID: Maria [name], female    DOB: 08-06-1970, 44 y.o.   MRN: [id number]  01/25/2015  Annual Exam   HPI This 44 y.o. female presents for Complete Physical Examination.  Last physical: 10-07-2013 Pap smear:  10-07-2013  WNL; regular menses (6 days);  Mammogram:  11-04-2013 WNL TDAP:  2014 Influenza: refuses Eye exam: +glasses Dental exam:2015    Review of Systems  Constitutional: Negative for fever, chills, diaphoresis, activity change, appetite change, fatigue and unexpected weight change.  HENT: Negative for congestion, dental problem, drooling, ear discharge, ear pain, facial swelling, hearing loss, mouth sores, nosebleeds, postnasal drip, rhinorrhea, sinus pressure, sneezing, sore throat, tinnitus, trouble swallowing and voice change.   Eyes: Negative for photophobia, pain, discharge, redness, itching and visual disturbance.  Respiratory: Negative for apnea, cough, choking, chest tightness, shortness of breath, wheezing and stridor.   Cardiovascular: Negative for chest pain, palpitations and leg swelling.  Gastrointestinal: Negative for nausea, vomiting, abdominal pain, diarrhea, constipation, blood in stool, abdominal distention, anal bleeding and rectal pain.  Endocrine: Negative for cold intolerance, heat intolerance, polydipsia, polyphagia and polyuria.  Genitourinary: Negative for dysuria, urgency, frequency, hematuria, flank pain, decreased urine volume, vaginal bleeding, vaginal discharge, enuresis, difficulty urinating, genital sores, vaginal pain, menstrual problem, pelvic pain and dyspareunia.  Musculoskeletal: Negative for myalgias, back pain, joint swelling, arthralgias, gait problem, neck pain and neck stiffness.  Skin: Negative for color change, pallor, rash and wound.  Allergic/Immunologic: Negative for environmental allergies, food allergies and immunocompromised state.  Neurological: Negative for dizziness, tremors, seizures, syncope, facial  asymmetry, speech difficulty, weakness, light-headedness, numbness and headaches.  Hematological: Negative for adenopathy. Does not bruise/bleed easily.  Psychiatric/Behavioral: Negative for suicidal ideas, hallucinations, behavioral problems, confusion, sleep disturbance, self-injury, dysphoric mood, decreased concentration and agitation. The patient is not nervous/anxious and is not hyperactive.     History reviewed. No pertinent past medical history. Past Surgical History  Procedure Laterality Date  . Rhinoplasty     No Known Allergies No current outpatient prescriptions on file.   No current facility-administered medications for this visit.   Social History   Social History  . Marital Status: Married    Spouse Name: N/A  . Number of Children: N/A  . Years of Education: N/A   Occupational History  . Not on file.   Social History Main Topics  . Smoking status: Never Smoker   . Smokeless tobacco: Not on file  . Alcohol Use: No  . Drug Use: No  . Sexual Activity: Yes   Other Topics Concern  . Not on file   Social History Narrative   Marital status: separated since 2014.  Not dating.      Children:  2 children (18, 38)      Lives: with 2 children.      Employment:  Insurance claims handler; full time      Tobacco: none      Alcohol: none      Drugs: none      Exercise: none   Family History  Problem Relation Age of Onset  . Hypertension Father        Objective:    BP 108/70 mmHg  Pulse 70  Temp(Src) 98.9 F (37.2 C) (Oral)  Resp 18  Ht 5' 2.5" (1.588 m)  Wt 115 lb (52.164 kg)  BMI 20.69 kg/m2  SpO2 99%  LMP 01/03/2015 Physical Exam  Constitutional: She is oriented to person, place, and time. She appears well-developed and well-nourished. No distress.  HENT:  Head: Normocephalic and atraumatic.  Right Ear: External ear normal.  Left Ear: External ear normal.  Nose: Nose normal.  Mouth/Throat: Oropharynx is clear and moist.  Eyes: Conjunctivae and EOM are  normal. Pupils are equal, round, and reactive to light.  Neck: Normal range of motion and full passive range of motion without pain. Neck supple. No JVD present. Carotid bruit is not present. Thyroid mass and thyromegaly present.  Cardiovascular: Normal rate, regular rhythm and normal heart sounds.  Exam reveals no gallop and no friction rub.   No murmur heard. Pulmonary/Chest: Effort normal and breath sounds normal. She has no wheezes. She has no rales. Right breast exhibits no inverted nipple, no mass, no nipple discharge, no skin change and no tenderness. Left breast exhibits no inverted nipple, no mass, no nipple discharge, no skin change and no tenderness. Breasts are symmetrical.  Abdominal: Soft. Bowel sounds are normal. She exhibits no distension and no mass. There is no tenderness. There is no rebound and no guarding.  Genitourinary: Vagina normal and uterus normal. There is no rash, tenderness or lesion on the right labia. There is no rash, tenderness or lesion on the left labia. Cervix exhibits no motion tenderness, no discharge and no friability. Right adnexum displays no mass, no tenderness and no fullness. Left adnexum displays no mass, no tenderness and no fullness.  Musculoskeletal:       Right shoulder: Normal.       Left shoulder: Normal.       Cervical back: Normal.  Lymphadenopathy:    She has no cervical adenopathy.  Neurological: She is alert and oriented to person, place, and time. She has normal reflexes. No cranial nerve deficit. She exhibits normal muscle tone. Coordination normal.  Skin: Skin is warm and dry. No rash noted. She is not diaphoretic. No erythema. No pallor.  Psychiatric: She has a normal mood and affect. Her behavior is normal. Judgment and thought content normal.  Nursing note and vitals reviewed.       Assessment & Plan:   1. Routine physical examination   2. Encounter for routine gynecological examination   3. Encounter for screening mammogram for  breast cancer   4. Screening for diabetes mellitus   5. Screening, lipid   6. Screening for HIV (human immunodeficiency virus)   7. Anemia, unspecified anemia type   8. Thyroid enlargement     1. Complete Physical Examination: anticipatory guidance --- exercise.  Pap smear UTD and obtained.  Refer mammogram. Immunizations reviewed; refused flu vaccine. 2. Gynecological exam: pap smear obtained; refer for mammogram.  Regular menses.   3.  Screenign DMII: obtain labs. 4. Screening lipid: obtain labs. 5.  Screening HIV: obtain labs. 6.  Anemia: stable; repeat today. 7. Thyromegaly with R thyroid nodule: refer for thyroid US; obtain thyroid labs.   Orders Placed This Encounter  Procedures  . MM Digital Screening    Standing Status: Future     Number of Occurrences:      Standing Expiration Date: 03/26/2016    Order Specific Question:  Reason for Exam (SYMPTOM  OR DIAGNOSIS REQUIRED)    Answer:  annual screening    Order Specific Question:  Is the patient pregnant?    Answer:  No    Order Specific Question:  Preferred imaging location?    Answer:  [facility]  . US Soft Tissue Head/Neck    115 LBS/NO NEEDS/INS/UHC/RLC/PT'S HUSBAND  E/PIC ORDER    Standing Status: Future  Number of Occurrences: 1     Standing Expiration Date: 03/26/2016    Order Specific Question:  Reason for Exam (SYMPTOM  OR DIAGNOSIS REQUIRED)    Answer:  R thyroid nodule versus enlargement    Order Specific Question:  Preferred imaging location?    Answer:  [facility]  . CBC with Differential/Platelet  . Comprehensive metabolic panel    Order Specific Question:  Has the patient fasted?    Answer:  Yes  . Hemoglobin A1c  . Lipid panel    Order Specific Question:  Has the patient fasted?    Answer:  Yes  . HIV antibody  . T4, free  . POCT urinalysis dipstick   No orders of the defined types were placed in this encounter.    No Follow-up on file.   Starlet Gallentine Elayne Guerin, M.D. [facility] [address] [place], [place]  [address] [phone] phone [phone] fax

## 2015-01-25 NOTE — Patient Instructions (Signed)

## 2015-01-26 ENCOUNTER — Other Ambulatory Visit: Payer: Self-pay

## 2015-01-26 DIAGNOSIS — Z1231 Encounter for screening mammogram for malignant neoplasm of breast: Secondary | ICD-10-CM

## 2015-01-26 LAB — HEMOGLOBIN A1C
HEMOGLOBIN A1C: 5.3 % (ref <5.7)
MEAN PLASMA GLUCOSE: 105 mg/dL (ref <117)

## 2015-01-27 LAB — PAP IG, CT-NG NAA, HPV HIGH-RISK
Chlamydia Probe Amp: NEGATIVE
GC PROBE AMP: NEGATIVE
HPV DNA High Risk: NOT DETECTED

## 2015-01-28 LAB — TSH: TSH: 0.838 u[IU]/mL (ref 0.350–4.500)

## 2015-02-03 ENCOUNTER — Ambulatory Visit
Admission: RE | Admit: 2015-02-03 | Discharge: 2015-02-03 | Disposition: A | Discharge status: Home / Self Care | Payer: 59 | Source: Ambulatory Visit | Attending: Family Medicine | Admitting: Family Medicine

## 2015-02-03 DIAGNOSIS — E049 Nontoxic goiter, unspecified: Secondary | ICD-10-CM

## 2015-02-18 NOTE — Progress Notes (Signed)
Dr Tamala Julian, I believe the order was entered correctly; it shows up in the system as an US Thyroid Biopsy.  We faxed the order to [facility] earlier today.  They will contact the patient to schedule an appointment.

## 2015-03-09 ENCOUNTER — Ambulatory Visit: Payer: 59

## 2015-03-30 ENCOUNTER — Ambulatory Visit
Admission: RE | Admit: 2015-03-30 | Discharge: 2015-03-30 | Disposition: A | Discharge status: Home / Self Care | Payer: 59 | Source: Ambulatory Visit | Attending: Family Medicine | Admitting: Family Medicine

## 2015-03-30 DIAGNOSIS — Z1231 Encounter for screening mammogram for malignant neoplasm of breast: Secondary | ICD-10-CM

## 2015-08-05 ENCOUNTER — Ambulatory Visit (INDEPENDENT_AMBULATORY_CARE_PROVIDER_SITE_OTHER): Payer: BLUE CROSS/BLUE SHIELD | Admitting: Family Medicine

## 2015-08-05 VITALS — BP 112/72 | HR 82 | Temp 98.9°F | Resp 15 | Ht 62.5 in | Wt 112.8 lb

## 2015-08-05 DIAGNOSIS — Z789 Other specified health status: Secondary | ICD-10-CM

## 2015-08-05 DIAGNOSIS — E041 Nontoxic single thyroid nodule: Secondary | ICD-10-CM

## 2015-08-05 DIAGNOSIS — R350 Frequency of micturition: Secondary | ICD-10-CM

## 2015-08-05 DIAGNOSIS — Z603 Acculturation difficulty: Secondary | ICD-10-CM

## 2015-08-05 LAB — POCT URINALYSIS DIP (MANUAL ENTRY)
BILIRUBIN UA: NEGATIVE
Blood, UA: NEGATIVE
GLUCOSE UA: NEGATIVE
Ketones, POC UA: NEGATIVE
Leukocytes, UA: NEGATIVE
NITRITE UA: NEGATIVE
Protein Ur, POC: NEGATIVE
Spec Grav, UA: 1.025
UROBILINOGEN UA: 0.2
pH, UA: 6

## 2015-08-05 LAB — POCT CBC
Granulocyte percent: 80.2 %G — AB (ref 37–80)
HEMATOCRIT: 34.7 % — AB (ref 37.7–47.9)
HEMOGLOBIN: 12.4 g/dL (ref 12.2–16.2)
LYMPH, POC: 1.8 (ref 0.6–3.4)
MCH, POC: 33.2 pg — AB (ref 27–31.2)
MCHC: 35.7 g/dL — AB (ref 31.8–35.4)
MCV: 93 fL (ref 80–97)
MID (cbc): 0.3 (ref 0–0.9)
MPV: 7.9 fL (ref 0–99.8)
PLATELET COUNT, POC: 171 10*3/uL (ref 142–424)
POC GRANULOCYTE: 8.4 — AB (ref 2–6.9)
POC LYMPH PERCENT: 17.2 %L (ref 10–50)
POC MID %: 2.6 %M (ref 0–12)
RBC: 3.73 M/uL — AB (ref 4.04–5.48)
RDW, POC: 13.8 %
WBC: 10.5 10*3/uL — AB (ref 4.6–10.2)

## 2015-08-05 LAB — POCT WET + KOH PREP
TRICH BY WET PREP: ABSENT
YEAST BY WET PREP: ABSENT
Yeast by KOH: ABSENT

## 2015-08-05 LAB — POC MICROSCOPIC URINALYSIS (UMFC): MUCUS RE: ABSENT

## 2015-08-05 LAB — GLUCOSE, POCT (MANUAL RESULT ENTRY): POC Glucose: 100 mg/dl — AB (ref 70–99)

## 2015-08-05 NOTE — Patient Instructions (Addendum)
You need to drink more water and less caffeine to avoid bladder irritation.  Consider trying daily pill of the cranberry sugar to prevent any bladder infections.   IF you received an x-ray today, you will receive an invoice from [facility]. Please contact [facility] at [phone] with questions or concerns regarding your invoice.   IF you received labwork today, you will receive an invoice from [employer]. Please contact [facility] at [phone] with questions or concerns regarding your invoice.   Our billing staff will not be able to assist you with questions regarding bills from these companies.  You will be contacted with the lab results as soon as they are available. The fastest way to get your results is to activate your My Chart account. Instructions are located on the last page of this paperwork. If you have not heard from [place] regarding the results in 2 weeks, please contact this office.     Kegel Exercises M?c tiu c?a bi t?p Kegel l c l?p v t?p th? d?c c? sn ch?u c?a b?n. Nh?ng c? ny ho?t ??ng nh? m?t ci vng tr? gip tr?c trng, m ??o, ru?t non, v t? cung. Khi c? b?p y?u ?i, cc vng hamm v nh?ng c? quan ny ???c di chuy?n kh?i v? tr bnh th??ng c?a chng. T?p th? d?c Kegel c th? t?ng c??ng c? b? sn ch?u v gip b?n c?i thi?n ki?m sot bng quang v ru?t, c?i thi?n ph?n ?ng tnh d?c, gip gi?m b?t nhi?u v?n ?? v c?m th?y khng tho?i mi khi mang [place]. Cc bi t?p Kegel c th? ???c th?c hi?n b?t c? n?i no v b?t c? lc no. LM TH? NO ?? TH?C HI?N NGHIN C?U KEGEL Xc ??nh c? b?ng c?a b?n. ?? lm ???c ?i?u ny, hy bp (bp) cc c? m b?n s? d?ng khi b?n c? g?ng ng?n ch?n dng n??c ti?u. B?n s? c?m th?y m?t s? kn trong vng m ??o (ph? n?) v s? c?ng th?ng trong vng tr?c trng (nam gi?i v ph? n?). Khi b?n b?t ??u, hy co l?i cc c? vng ch?u trong kho?ng 2-5 giy, sau ? th? gin chng trong 2-5 giy. ?y l m?t b?. Lm 4-5  b? v?i m?t t?m d?ng ng?n ? gi?a. Ch?a c? ch?u c?a b?n trong 8-10 giy, sau ? th? gin chng trong 8-10 giy. Lm 4-5 b?. N?u b?n khng th? co bp c? x??ng ch?u trong 8-10 giy, hy th? 5-7 giy v lm vi?c theo cch c?a b?n ln ??n 8-10 giy. M?c tiu c?a b?n l 4-5 b? 10 l?n co bp m?i ngy. Gi? d? dy, mng, v chn c?a b?n th? gin trong cc bi t?p. Th?c hi?n c? hai ?o?n ng?n v di. Thay ??i v? tr c?a b?n. Th?c hi?n cc c?n co gi?t 3-4 l?n m?i ngy. Th?c hi?n cc b? trong khi b?n: N?m trn gi??ng vo bu?i sng. ??ng ? b?a tr?a. Ng?i vo bu?i chi?u mu?n. N?m trn gi??ng vo ban ?m. B?n nn gi?m 40-50 l?n m?i ngy. Khng t?p th? d?c nhi?u h?n Kegel m?i ngy so v?i khuy?n co. T?p th? d?c qu m?c c th? gy ra s? m?t m?i c?. Ti?p t?c t?p th? d?c trong t nh?t 15-20 tu?n ho?c theo ch? d?n c?a ng??i ch?m [place].  The goal of Kegel exercises is to isolate and exercise your pelvic floor muscles. These muscles act as a hammock that supports the rectum, vagina, small intestine, and uterus. As the muscles  weaken, the hammock sags and these organs are displaced from their normal positions. Kegel exercises can strengthen your pelvic floor muscles and help you to improve bladder and bowel control, improve sexual response, and help reduce many problems and some discomfort during pregnancy. Kegel exercises can be done anywhere and at any time. HOW TO PERFORM [place] your pelvic floor muscles. To do this, squeeze (contract) the muscles that you use when you try to stop the flow of urine. You will feel a tightness in the vaginal area (women) and a tight lift in the rectal area (men and women).  When you begin, contract your pelvic muscles tight for 2-5 seconds, then relax them for 2-5 seconds. This is one set. Do 4-5 sets with a short pause in between.  Contract your pelvic muscles for 8-10 seconds, then relax them for 8-10 seconds. Do 4-5 sets. If you cannot contract your pelvic muscles for 8-10  seconds, try 5-7 seconds and work your way up to 8-10 seconds. Your goal is 4-5 sets of 10 contractions each day. Keep your stomach, buttocks, and legs relaxed during the exercises. Perform sets of both short and long contractions. Vary your positions. Perform these contractions 3-4 times per day. Perform sets while you are:   Lying in bed in the morning.  Standing at lunch.  Sitting in the late afternoon.  Lying in bed at night. You should do 40-50 contractions per day. Do not perform more Kegel exercises per day than recommended. Overexercising can cause muscle fatigue. Continue these exercises for for at least 15-20 weeks or as directed by your caregiver.   This information is not intended to replace advice given to you by your health care provider. Make sure you discuss any questions you have with your health care provider.   Document Released: 03/28/2012 Document Revised: 05/02/2014 Document Reviewed: 03/28/2012 Elsevier Interactive Patient Education 2016 [employer]. Urinary Frequency S? l?n m?t ng??i bnh th??ng ?i ti?u ph? thu?c vo l??ng ch?t l?ng h? u?ng vo v l??ng ch?t l?ng m h? m?t. N?u nhi?t ?? nng v c ?? ?m cao, ng??i ? s? ?? m? hi nhi?u h?n v th??ng th? nhi?u h?n m?t cht. Nh?ng y?u t? ny lm gi?m t?n s? ti?u ti?n ???c coi l bnh th??ng. S? ti?n b?n u?ng ???c xc ??nh m?t cch d? dng, nh?ng s? l??ng ch?t l?ng b? m?t ?i khi kh tnh h?n. Ch?t l?ng b? m?t theo hai cch: M?t n??c h?p l th??ng ???c ?o b?ng l??ng n??c ti?u m b?n thot kh?i. M?t ch?t l?ng c?ng c th? x?y ra v?i tiu ch?y. M?t ch?t l?ng khng nh?n th?c ???c kh kh?n h?n ?? ?o l??ng. ? l do b?c h?i. S? m?t mt c?a ch?t l?ng x?y ra thng qua vi?c th? v ?? m? hi. N th??ng t? m?t cht t h?n m?t ph?n t? cho m?t t h?n m?t ph?n t? ch?t l?ng m?i ngy. ? nhi?t ?? bnh th??ng v m?c ?? ho?t ??ng, ng??i bnh th??ng c th? ?i ti?u t? 4 ??n 7 l?n trong m?t kho?ng th?i gian 24 gi?. C?n ?i ti?u nhi?u h?n c  th? ch? ra v?n ??. N?u m?t ng??i ?i ti?u t? 4 ??n 7 l?n trong 24 gi? v m?i l?n c l??ng l?n, ?i?u ? c th? ch? ra m?t v?n ?? khc v?i ng??i ?i ti?u t? 4 ??n 7 l?n m?t ngy v c kh?i l??ng nh?Marland Kitchen Th?i gian ?i ti?u c?ng r?t quan tr?ng. H?u h?t vi?c ?  i ti?u ??u ph?i ???c th?c hi?n trong gi? lm vi?c. Vo ban ?m ?? ?i ti?u th??ng xuyn c th? ch? ra m?t s? v?n ??. NGUYN NHN Bng quang l c? quan ? vng b?ng d??i c ch?a n??c ti?u. Gi?ng nh? m?t qu? bng, n n? m?t s? khi n ??y. Th?n kinh c?a b?n c?m nh?n ?i?u ny v ni v?i b?n r?ng ? ??n lc ?i t?m. C m?t s? l do m b?n c th? c?m th?y c?n ph?i ?i ti?u th??ng xuyn h?n bnh th??ng. Chng bao g?m: Nhi?m trng ???ng ti?t ni?u. ?i?u ny th??ng lin quan ??n cc d?u hi?u khc nh? ??t khi b?n ?i ti?u. ? nam gi?i, cc v?n ?? v? tuy?n ti?n li?t (m?t tuy?n c ch n?m g?n ?ng d?n n??c ti?u ra kh?i c? th? b?n). C hai l do khi?n tuy?n ti?n li?t c th? gy ra m?t t?n s? ti?u ti?n t?ng ln: M?t tuy?n ti?n li?t phng to m khng ?? cho bng quang tr?ng r?ng. N?u bng quang ch? m?t n?a r?ng khi b?n ?i ti?u, th n ch? c m?t n?a kh? n?ng ?? l?p ??y tr??c khi b?n ?i ti?u. Cc dy th?n kinh trong bng quang tr? nn m?n c?m h?n v?i kch th??c t?ng ln c?a tuy?n ti?n li?t ngay c? khi bng quang r?ng hon ton. Mang [place]. Bo ph. Kh?i l??ng d? th?a c th? gy ra v?n ?? cho ph? n? nhi?u h?n nam gi?i. S?i bng quang ho?c cc v?n ?? bng quang khc. Caffeine. R??u. Thu?c men. V d?, thu?c gip c? th? thot ???c ch?t l?ng b? sung (thu?c l?i ni?u) lm t?ng l??ng n??c ti?u. M?t s? lo?i thu?c khc ph?i ???c dng v?i nhi?u ch?t l?ng. C? ho?c suy nh??c th?n kinh. ?y c th? l do ch?n th??ng t?y s?ng, ??t qu,, ch?ng ?a x? c?ng, ho?c b?nh Parkinson. B?nh ti?u ???ng ko di c th? lm gi?m c?m gic bng quang. S? m?t mt c?a c?m gic ny lm cho c?m gic bng quang c?n ph?i ???c lm tr?ng. Trong m?t kho?ng th?i gian nhi?u n?m, bng quang ???c ko di ra b?i qu nhi?u. ?i?u  ny lm y?u c? bng quang ?? bng quang khng tr?ng r?ng v c t n?ng l?c ?? l?p ??y n??c ti?u m?i. Vim bng quang trung gian (cn g?i l h?i ch?ng bng quang ?au). Tnh tr?ng ny pht tri?n v cc m bn trong bng quang b? vim (vim l cch ph?n ?ng c?a c? th? v?i th??ng tch ho?c nhi?m trng). N gy ?au v ?i ti?u th??ng xuyn. N x?y ra ? ph? n? th??ng xuyn h?n ? nam gi?i. CH?N ?ON ?? quy?t ??nh nh?ng g c th? gy t?n s? ni?u, nh cung c?p d?ch v? ch?m [place] s?c kho? c?a b?n c th? s?: H?i v? cc tri?u ch?ng b?n ? nh?n th?y. H?i v? s?c kho? t?ng th? c?a b?n. ?i?u ny s? bao g?m cc cu h?i v? b?t k? lo?i thu?c b?n ?ang dng. Th?c hi?n khm s?c kho?. ??t m?t s? bi ki?m tra. Nh?ng th? ny c th? bao g?m: Xt nghi?m mu ?? ki?m tra b?nh ti?u ???ng ho?c cc v?n ?? s?c kho? khc c th? gp ph?n gy ra v?n ??. Ki?m tra n??c ti?u. ?i?u ny c th? ?o l??ng n??c ti?u v p l?c ln bng quang. Ki?m tra h? th?ng th?n kinh (no, t?y s?ng, th?n kinh). ?y l h? th?ng c?m nh?n nhu c?u ?i ti?u. M?t th? nghi?m bng quang ??  ki?m tra xem n c s?n ph?m no tr?ng hon ton khi b?n ?i ti?u. Cystoscopy. Th? nghi?m ny s? d?ng m?t ?ng m?ng v?i m?t my ?nh nh? trn ?Delane Ginger cung c?p m?t ci nhn bn trong ni?u ??o v bng quang c?a b?n ?? xem n?u c v?n ??. Th? nghi?m hnh ?nh. B?n c th? ???c cho m?t ch?t nhu?m t??ng ph?n v sau ? ???c yu c?u ?i ti?u. X-quang ???c th?c hi?n ?? xem cch bng quang c?a b?n ?ang lm vi?c. ?I?U TR? ?i?u quan tr?ng l b?n ph?i ???c ?nh gi ?? xc ??nh xem s? ti?n ho?c t?n su?t m b?n c l b?t th??ng ho?c b?t th??ng. N?u n ???c tm th?y l b?t th??ng, nguyn nhn c?n ???c xc ??nh v ?i?u ny th??ng c th? ???c pht hi?n ra d? dng. Ty theo nguyn nhn, ?i?u tr? c th? bao g?m thu?c, kch thch dy th?n kinh, ho?c ph?u thu?t. Khng c qu nhi?u th? m b?n c th? lm khi m?t c nhn thay ??i t?n su?t ni?u ??o. ?i?u quan tr?ng l b?n cn b?ng l??ng ch?t l?ng c?n thi?t ?? b ??p  cho ho?t ??ng c?a b?n v nhi?t ??. Cc v?n ?? y t? s? ???c ch?n ?on v ch?m [place] b?i bc s? c?a b?n  The number of times a normal person urinates depends upon how much liquid they take in and how much liquid they are losing. If the temperature is hot and there is high humidity, then the person will sweat more and usually breathe a little more frequently. These factors decrease the amount of frequency of urination that would be considered normal. The amount you drink is easily determined, but the amount of fluid lost is sometimes more difficult to calculate.  Fluid is lost in two ways:  Sensible fluid loss is usually measured by the amount of urine that you get rid of. Losses of fluid can also occur with diarrhea.  Insensible fluid loss is more difficult to measure. It is caused by evaporation. Insensible loss of fluid occurs through breathing and sweating. It usually ranges from a little less than a quart to a little more than a quart of fluid a day. In normal temperatures and activity levels, the average person may urinate 4 to 7 times in a 24-hour period. Needing to urinate more often than that could indicate a problem. If one urinates 4 to 7 times in 24 hours and has large volumes each time, that could indicate a different problem from one who urinates 4 to 7 times a day and has small volumes. The time of urinating is also important. Most urinating should be done during the waking hours. Getting up at night to urinate frequently can indicate some problems. CAUSES  The bladder is the organ in your lower abdomen that holds urine. Like a balloon, it swells some as it fills up. Your nerves sense this and tell you it is time to head for the bathroom. There are a number of reasons that you might feel the need to urinate more often than usual. They include:  Urinary tract infection. This is usually associated with other signs such as burning when you urinate.  In men, problems with the prostate (a  walnut-size gland that is located near the tube that carries urine out of your body). There are two reasons why the prostate can cause an increased frequency of urination:  An enlarged prostate that does not let the bladder empty well. If the  bladder only half empties when you urinate, then it only has half the capacity to fill before you have to urinate again.  The nerves in the bladder become more hypersensitive with an increased size of the prostate even if the bladder empties completely.  Pregnancy.  Obesity. Excess weight is more likely to cause a problem for women than for men.  Bladder stones or other bladder problems.  Caffeine.  Alcohol.  Medications. For example, drugs that help the body get rid of extra fluid (diuretics) increase urine production. Some other medicines must be taken with lots of fluids.  Muscle or nerve weakness. This might be the result of a spinal cord injury, a stroke, multiple sclerosis, or Parkinson disease.  Long-standing diabetes can decrease the sensation of the bladder. This loss of sensation makes it harder to sense the bladder needs to be emptied. Over a period of years, the bladder is stretched out by constant overfilling. This weakens the bladder muscles so that the bladder does not empty well and has less capacity to fill with new urine.  Interstitial cystitis (also called painful bladder syndrome). This condition develops because the tissues that line the inside of the bladder are inflamed (inflammation is the body's way of reacting to injury or infection). It causes pain and frequent urination. It occurs in women more often than in men. DIAGNOSIS   To decide what might be causing your urinary frequency, your health care provider will probably:  Ask about symptoms you have noticed.  Ask about your overall health. This will include questions about any medications you are taking.  Do a physical examination.  Order some tests. These might  include:  A blood test to check for diabetes or other health issues that could be contributing to the problem.  Urine testing. This could measure the flow of urine and the pressure on the bladder.  A test of your neurological system (the brain, spinal cord, and nerves). This is the system that senses the need to urinate.  A bladder test to check whether it is emptying completely when you urinate.  Cystoscopy. This test uses a thin tube with a tiny camera on it. It offers a look inside your urethra and bladder to see if there are problems.  Imaging tests. You might be given a contrast dye and then asked to urinate. X-rays are taken to see how your bladder is working. TREATMENT  It is important for you to be evaluated to determine if the amount or frequency that you have is unusual or abnormal. If it is found to be abnormal, the cause should be determined and this can usually be found out easily. Depending upon the cause, treatment could include medication, stimulation of the nerves, or surgery. There are not too many things that you can do as an individual to change your urinary frequency. It is important that you balance the amount of fluid intake needed to compensate for your activity and the temperature. Medical problems will be diagnosed and taken care of by your physician. There is no particular bladder training such as Kegel exercises that you can do to help urinary frequency. This is an exercise that is usually recommended for people who have leaking of urine when they laugh, cough, or sneeze. HOME CARE INSTRUCTIONS   Take any medications your health care provider prescribed or suggested. Follow the directions carefully.  Practice any lifestyle changes that are recommended. These might include:  Drinking less fluid or drinking at different times of the  day. If you need to urinate often during the night, for example, you may need to stop drinking fluids early in the evening.  Cutting  down on caffeine or alcohol. They both can make you need to urinate more often than normal. Caffeine is found in coffee, tea, and sodas.  Losing weight, if that is recommended.  Keep a journal or a log. You might be asked to record how much you drink and when and where you feel the need to urinate. This will also help evaluate how well the treatment provided by your physician is working. SEEK MEDICAL CARE IF:   Your need to urinate often gets worse.  You feel increased pain or irritation when you urinate.  You notice blood in your urine.  You have questions about any medications that your health care provider recommended.  You notice blood, pus, or swelling at the site of any test or treatment procedure.  You develop a fever of more than 100.52F (38.1C). SEEK IMMEDIATE MEDICAL CARE IF:  You develop a fever of more than 102.53F (38.9C).   This information is not intended to replace advice given to you by your health care provider. Make sure you discuss any questions you have with your health care provider.   Document Released: 02/05/2009 Document Revised: 05/02/2014 Document Reviewed: 02/05/2009 Elsevier Interactive Patient Education [employer].

## 2015-08-05 NOTE — Progress Notes (Signed)
By signing my name below, I, Mesha Guiyard, attest that this documentation has been prepared under the direction and in the presence of Delman Cheadle, MD.  Electronically Signed: Verlee Monte, Medical Scribe. 08/05/2015. 9:21 AM.  Subjective:    Patient ID: Maria [name], female    DOB: 1971-03-12, 45 y.o.   MRN: [id number]  HPI Chief Complaint  Patient presents with  . Annual Exam  . Urinary Frequency    PAIN WHEN URINATING AND DISCHARGE X 2WEEKS    HPI Comments: Maria [name] is a 45 y.o. female who presents to the [facility] complaining of frequent urination. Pt does not speak English so she had over the phone interpreter translate for her. Pt reports frequency after drinking fluids and 2-3 times during the night. She says she drinks water, herbal tea, coffee, and occationally beer. Pt reports pain in her lower abdomen when she holds her urine. Pt reports normal BM daily. Pt denies dysuria, urgency, difficulty with urinating, menstrual problems, or vaginal discharge.    Came in 6 months ago for physical. Pt had normal mammogram. She had a thyroid ultrasound showing bilateral enlargement with a right nodule abdominal lesion that was reccommended to be biopsied. Pt never scheduled the appointment. Her thyroid function is normal. Her other routine screening was normal. She had a neg pap smear for HPV.  There are no active problems to display for this patient.  History reviewed. No pertinent past medical history. Past Surgical History  Procedure Laterality Date  . Rhinoplasty     No Known Allergies Prior to Admission medications   Not on File   Social History   Social History  . Marital Status: Married    Spouse Name: N/A  . Number of Children: N/A  . Years of Education: N/A   Occupational History  . Not on file.   Social History Main Topics  . Smoking status: Never Smoker   . Smokeless tobacco: Not on file  . Alcohol Use: No  . Drug Use: No  . Sexual  Activity: Yes   Other Topics Concern  . Not on file   Social History Narrative   Marital status: separated since 2014.  Not dating.      Children:  2 children (18, 79)      Lives: with 2 children.      Employment:  Insurance claims handler; full time      Tobacco: none      Alcohol: none      Drugs: none      Exercise: none      Review of Systems  Constitutional: Negative for fever, chills, activity change, appetite change and unexpected weight change.  Gastrointestinal: Negative for vomiting, abdominal pain, diarrhea, constipation and abdominal distention.  Endocrine: Positive for polyuria. Negative for polydipsia and polyphagia.  Genitourinary: Positive for frequency and pelvic pain (when holding urine). Negative for dysuria, urgency, hematuria, flank pain, decreased urine volume, vaginal discharge, enuresis, difficulty urinating, genital sores, vaginal pain and menstrual problem.  Musculoskeletal: Positive for back pain. Negative for joint swelling and gait problem.  Skin: Negative for rash.  Allergic/Immunologic: Negative for immunocompromised state.  Psychiatric/Behavioral: Positive for sleep disturbance.       Objective:   Physical Exam  Constitutional: She appears well-developed and well-nourished. No distress.  HENT:  Head: Normocephalic and atraumatic.  Eyes: Conjunctivae are normal.  Neck: Neck supple.  Cardiovascular: Normal rate, regular rhythm and normal heart sounds.   Pulmonary/Chest: Effort normal and breath sounds  normal.  Abdominal: Soft. Bowel sounds are normal. There is no tenderness. There is no CVA tenderness.  Genitourinary: Vagina normal and uterus normal. There is no rash, tenderness, lesion or injury on the right labia. There is no rash, tenderness, lesion or injury on the left labia. Uterus is not enlarged and not tender.  Urethra is normal  Neurological: She is alert.  Skin: Skin is warm and dry.  Psychiatric: She has a normal mood and affect. Her behavior is  normal.  Nursing note and vitals reviewed.  Filed Vitals:   08/05/15 0838  BP: 112/72  Pulse: 82  Temp: 98.9 F (37.2 C)  TempSrc: Oral  Resp: 15  Height: 5' 2.5" (1.588 m)  Weight: 112 lb 12.8 oz (51.166 kg)  SpO2: 98%   Results for orders placed or performed in visit on 08/05/15  POCT urinalysis dipstick  Result Value Ref Range   Color, UA yellow yellow   Clarity, UA clear clear   Glucose, UA negative negative   Bilirubin, UA negative negative   Ketones, POC UA negative negative   Spec Grav, UA 1.025    Blood, UA negative negative   pH, UA 6.0    Protein Ur, POC negative negative   Urobilinogen, UA 0.2    Nitrite, UA Negative Negative   Leukocytes, UA Negative Negative  POCT Microscopic Urinalysis ([facility])  Result Value Ref Range   WBC,UR,HPF,POC None None WBC/hpf   RBC,UR,HPF,POC None None RBC/hpf   Bacteria Few (A) None, Too numerous to count   Mucus Absent Absent   Epithelial Cells, UR Per Microscopy Few (A) None, Too numerous to count cells/hpf  POCT glucose (manual entry)  Result Value Ref Range   POC Glucose 100 (A) 70 - 99 mg/dl  POCT Wet + KOH Prep  Result Value Ref Range   Yeast by KOH Absent Present, Absent   Yeast by wet prep Absent Present, Absent   WBC by wet prep None None, Few, Too numerous to count   Clue Cells Wet Prep HPF POC Few (A) None, Too numerous to count   Trich by wet prep Absent Present, Absent   Bacteria Wet Prep HPF POC Few None, Few, Too numerous to count   Epithelial Cells By [employer] Pref ([facility]) Few None, Few, Too numerous to count   RBC,UR,HPF,POC None None RBC/hpf  POCT CBC  Result Value Ref Range   WBC 10.5 (A) 4.6 - 10.2 K/uL   Lymph, poc 1.8 0.6 - 3.4   POC LYMPH PERCENT 17.2 10 - 50 %L   MID (cbc) 0.3 0 - 0.9   POC MID % 2.6 0 - 12 %M   POC Granulocyte 8.4 (A) 2 - 6.9   Granulocyte percent 80.2 (A) 37 - 80 %G   RBC 3.73 (A) 4.04 - 5.48 M/uL   Hemoglobin 12.4 12.2 - 16.2 g/dL   HCT, POC 34.7 (A) 37.7 - 47.9 %   MCV 93.0 80  - 97 fL   MCH, POC 33.2 (A) 27 - 31.2 pg   MCHC 35.7 (A) 31.8 - 35.4 g/dL   RDW, POC 13.8 %   Platelet Count, POC 171 142 - 424 K/uL   MPV 7.9 0 - 99.8 fL     Assessment & Plan:   1. Frequent urination - exam and labs relatively normal.  The language barrier makes this VERY difficult to determine the etiology of but get a full/accurate understanding of he concerns/complaints. It sounds like she is not drinking many fluids  during the day, mainly herbal tea in the evenings and then experiences urinary frequency in the evenings waking 2 times/night to urinate.  I suspect there is no pathology behind this - more behavioral. No caffeine/etoh to avoid bladder irritants, increase H2O.  Try Kegel exercises. Try cranberry sugar supp w or w/o probiotic to support urinary tract health.  Consider urogyn referral if sxs continue.  2. Language barrier affecting health care - used [place] phone interpretor for entirety of visit.  3. Right thyroid nodule - pt agrees to go for biopsy as was prev recommended.  She does need to be informed of any scheduling with a [place] interpreter. Order placed   Pt asked for CPE today but I did not do this as her last was 6 mos prior - I think she was mainly concerned about her urinary frequency as was unsure if these could be evaluated with a problem-based visit. RTC in 6 mos for CPE.  Orders Placed This Encounter  Procedures  . US Thyroid Biopsy    Pt does not speak English - needs [place] interpreter    Standing Status: Future     Number of Occurrences:      Standing Expiration Date: 10/04/2016    Scheduling Instructions:     Please call [place] interpreter to inform pt when you call her to schedule the appointment    Order Specific Question:  Reason for Exam (SYMPTOM  OR DIAGNOSIS REQUIRED)    Answer:  Dominant 30 x 14 x 22 mm complex mostly cystic nodule, upper pole    Order Specific Question:  Preferred imaging location?    Answer:  [facility]    . Thyroid Panel With TSH  . POCT urinalysis dipstick  . POCT Microscopic Urinalysis ([facility])  . POCT glucose (manual entry)  . POCT Wet + KOH Prep  . POCT CBC   Over 40 min spent in face-to-face evaluation of and consultation with patient and coordination of care.  Over 50% of this time was spent counseling this patient. Language level caveat.  I personally performed the services described in this documentation, which was scribed in my presence. The recorded information has been reviewed and considered, and addended by me as needed.  Delman Cheadle, MD MPH

## 2015-08-06 LAB — THYROID PANEL WITH TSH
Free Thyroxine Index: 1.6 (ref 1.4–3.8)
T3 Uptake: 34 % (ref 22–35)
T4, Total: 4.7 ug/dL (ref 4.5–12.0)
TSH: 1.2 mIU/L

## 2015-08-07 LAB — URINE CULTURE: Colony Count: 5000

## 2015-08-10 ENCOUNTER — Encounter: Payer: Self-pay | Admitting: Family Medicine

## 2015-10-14 ENCOUNTER — Ambulatory Visit
Admission: RE | Admit: 2015-10-14 | Discharge: 2015-10-14 | Disposition: A | Discharge status: Home / Self Care | Payer: BLUE CROSS/BLUE SHIELD | Source: Ambulatory Visit | Attending: Family Medicine | Admitting: Family Medicine

## 2015-10-14 ENCOUNTER — Other Ambulatory Visit (HOSPITAL_COMMUNITY)
Admission: RE | Admit: 2015-10-14 | Discharge: 2015-10-14 | Disposition: A | Discharge status: Home / Self Care | Payer: BLUE CROSS/BLUE SHIELD | Source: Ambulatory Visit | Attending: Radiology | Admitting: Radiology

## 2015-10-14 DIAGNOSIS — E041 Nontoxic single thyroid nodule: Secondary | ICD-10-CM | POA: Insufficient documentation

## 2015-10-23 ENCOUNTER — Encounter: Payer: Self-pay | Admitting: *Deleted

## 2016-03-28 ENCOUNTER — Ambulatory Visit (INDEPENDENT_AMBULATORY_CARE_PROVIDER_SITE_OTHER): Payer: BLUE CROSS/BLUE SHIELD | Admitting: Physician Assistant

## 2016-03-28 VITALS — BP 122/82 | HR 76 | Temp 97.7°F | Resp 17 | Ht 62.5 in | Wt 110.0 lb

## 2016-03-28 DIAGNOSIS — E041 Nontoxic single thyroid nodule: Secondary | ICD-10-CM | POA: Insufficient documentation

## 2016-03-28 DIAGNOSIS — R350 Frequency of micturition: Secondary | ICD-10-CM | POA: Diagnosis not present

## 2016-03-28 DIAGNOSIS — Z789 Other specified health status: Secondary | ICD-10-CM

## 2016-03-28 LAB — POCT URINALYSIS DIP (MANUAL ENTRY)
Bilirubin, UA: NEGATIVE
GLUCOSE UA: NEGATIVE
Ketones, POC UA: NEGATIVE
Leukocytes, UA: NEGATIVE
NITRITE UA: NEGATIVE
PH UA: 5.5
Protein Ur, POC: NEGATIVE
RBC UA: NEGATIVE
Spec Grav, UA: 1.01
UROBILINOGEN UA: 0.2

## 2016-03-28 LAB — POCT GLYCOSYLATED HEMOGLOBIN (HGB A1C): HEMOGLOBIN A1C: 5.5

## 2016-03-28 LAB — POC MICROSCOPIC URINALYSIS (UMFC): Mucus: ABSENT

## 2016-03-28 LAB — GLUCOSE, POCT (MANUAL RESULT ENTRY): POC Glucose: 89 mg/dl (ref 70–99)

## 2016-03-28 NOTE — Progress Notes (Signed)
Patient ID: Maria [name], female    DOB: 02-17-1971, 45 y.o.   MRN: [id number]  PCP: No primary care provider on file.  Chief Complaint  Patient presents with  . Follow-up    frequent urination     Subjective:   Presents for evaluation of frequent urination and "general check up for women's health."  Telephone interpreter [facility] [employer] [phone]) used for this visit.  Seen here for same 08/05/2015 at which time she reported urinary frequency and vaginal discharge x 2 weeks. The urinary frequency was occuring at night, causing her to get up 2-3 times each night. She denied constipation, dysuria, urgency. UA dip was negative. Micro revealed few bacteria and few epithelial cells. Urine culture Insignificant growth (5000 colonies/mL). Glucose 100 CBC revealed mild elevation of WBC (10.5) Wet prep few Clue cells, otherwise normal Thyroid profile was normal. Examination was normal. She was advised to increase her DAYTIME fluids and minimize the oral fluids in the evenings, avoid caffeine, EtOH and bladder irritants, try Kegel exercises.  Wellness exam 01/25/2015. Pt had normal mammogram. She had a thyroid ultrasound showing bilateral enlargement with a right nodule abdominal lesion that was reccommended to be biopsied. Her thyroid function was normal. Her other routine screening was normal. She had a neg pap smear for HPV.  Thyroid biopsy was benign 09/2015.  Today she reports that the urinary symptoms are unchanged. No burning. Awakens during the night, suddenly, with urge to urinate. Urinates more at night, not so much during the day.  To bed about 11 pm, awakens 2-2:30 with urge to urinate, again 4-5 am, again 6-7 am. Awakens for the day 7-7:30 am, and urinates again. No hematuria. 2 bottles of water/day. Sometimes only 1 bottle. 16-20 ounces each. Sometimes another glass of water in the evenings before bed. Occasional EtOH, no caffeine  BM once daily, usually in the  morning. Soft. Only occasionally hard, requiring straining.   Review of Systems No chest pain, SOB, HA, dizziness, vision change, N/V, diarrhea, constipation, dysuria, myalgias, arthralgias or rash.     There are no active problems to display for this patient.    Prior to Admission medications   Not on File     No Known Allergies     Objective:  Physical Exam  Constitutional: She is oriented to person, place, and time. She appears well-developed and well-nourished. She is active and cooperative. No distress.  BP 122/82 (BP Location: Right Arm, Patient Position: Sitting, Cuff Size: Normal)   Pulse 76   Temp 97.7 F (36.5 C) (Oral)   Resp 17   Ht 5' 2.5" (1.588 m)   Wt 110 lb (49.9 kg)   LMP 03/23/2016 (Approximate)   SpO2 99%   BMI 19.80 kg/m   HENT:  Head: Normocephalic and atraumatic.  Right Ear: Hearing normal.  Left Ear: Hearing normal.  Eyes: Conjunctivae are normal. No scleral icterus.  Neck: Normal range of motion. Neck supple. No thyromegaly present.  Cardiovascular: Normal rate, regular rhythm and normal heart sounds.   Pulses:      Radial pulses are 2+ on the right side, and 2+ on the left side.  Pulmonary/Chest: Effort normal and breath sounds normal.  Abdominal: Soft. Normal appearance and bowel sounds are normal. There is no hepatosplenomegaly. There is no tenderness. There is no CVA tenderness.  Lymphadenopathy:       Head (right side): No tonsillar, no preauricular, no posterior auricular and no occipital adenopathy present.  Head (left side): No tonsillar, no preauricular, no posterior auricular and no occipital adenopathy present.    She has no cervical adenopathy.       Right: No supraclavicular adenopathy present.       Left: No supraclavicular adenopathy present.  Neurological: She is alert and oriented to person, place, and time. No sensory deficit.  Skin: Skin is warm, dry and intact. No rash noted. No cyanosis or erythema. Nails show no  clubbing.  Psychiatric: She has a normal mood and affect. Her speech is normal and behavior is normal.       Results for orders placed or performed in visit on 03/28/16  POCT urinalysis dipstick  Result Value Ref Range   Color, UA yellow yellow   Clarity, UA clear clear   Glucose, UA negative negative   Bilirubin, UA negative negative   Ketones, POC UA negative negative   Spec Grav, UA 1.010    Blood, UA negative negative   pH, UA 5.5    Protein Ur, POC negative negative   Urobilinogen, UA 0.2    Nitrite, UA Negative Negative   Leukocytes, UA Negative Negative  POCT Microscopic Urinalysis ([facility])  Result Value Ref Range   WBC,UR,HPF,POC None None WBC/hpf   RBC,UR,HPF,POC None None RBC/hpf   Bacteria None None, Too numerous to count   Mucus Absent Absent   Epithelial Cells, UR Per Microscopy None None, Too numerous to count cells/hpf  POCT glucose (manual entry)  Result Value Ref Range   POC Glucose 89 70 - 99 mg/dl  POCT glycosylated hemoglobin (Hb A1C)  Result Value Ref Range   Hemoglobin A1C 5.5        Assessment & Plan:   1. Frequent urination Await UCx. Expect it will be negative/insignificant again. Repeated recommendations to increase water during the day, but minimize oral hydration after her evening meal. Increase dietary fiber to maintain soft stools. Refer to urology for additional evaluation. - POCT urinalysis dipstick - POCT Microscopic Urinalysis ([facility]) - Urine culture - POCT glucose (manual entry) - POCT glycosylated hemoglobin (Hb A1C) - Ambulatory referral to Urology  2. Language barrier affecting health care Interpreter used for this visit. Interpreter repeatedly had to ask the patient to stop talking to allow her to translate to me, and patient repeated the same questions after answers were provided. Given the additional complexity of the visit caused by this issue, advised patient to return for wellness visit at a later date.   Fara Chute,  PA-C Physician Assistant-Certified [facility]

## 2016-03-28 NOTE — Patient Instructions (Addendum)
I will contact you with the remaining lab results. They are normal, as expected, we will schedule you to see a urologist. Try to drink 64 ounces of water every day. Drink 32 ounces before lunch and the other 32 ounces before supper.  Please return for your well woman visit at your convenience, when you are not ill/sick with another problem, so we can address all your other health maintenance needs.   [facility]  [address], [place], [place] [address]  Phone: [phone]  [facility] [place], [place], [place] [address]  Phone: [phone]  [place] [facility] [address], [place], [place] [address]  Phone: [phone]      IF you received an x-ray today, you will receive an invoice from [facility]. Please contact [facility] at [phone] with questions or concerns regarding your invoice.   IF you received labwork today, you will receive an invoice from [employer]. Please contact [facility] at [phone] with questions or concerns regarding your invoice.   Our billing staff will not be able to assist you with questions regarding bills from these companies.  You will be contacted with the lab results as soon as they are available. The fastest way to get your results is to activate your My Chart account. Instructions are located on the last page of this paperwork. If you have not heard from [place] regarding the results in 2 weeks, please contact this office.

## 2016-03-30 LAB — URINE CULTURE: Organism ID, Bacteria: NO GROWTH

## 2016-05-16 DIAGNOSIS — R350 Frequency of micturition: Secondary | ICD-10-CM | POA: Insufficient documentation

## 2016-05-23 ENCOUNTER — Encounter: Payer: Self-pay | Admitting: Student

## 2016-05-23 DIAGNOSIS — R351 Nocturia: Secondary | ICD-10-CM | POA: Insufficient documentation

## 2016-05-23 DIAGNOSIS — R350 Frequency of micturition: Secondary | ICD-10-CM

## 2016-06-27 ENCOUNTER — Ambulatory Visit (INDEPENDENT_AMBULATORY_CARE_PROVIDER_SITE_OTHER): Payer: BLUE CROSS/BLUE SHIELD | Admitting: Physician Assistant

## 2016-06-27 VITALS — BP 110/70 | HR 74 | Temp 98.5°F | Resp 18 | Ht 62.5 in | Wt 113.0 lb

## 2016-06-27 DIAGNOSIS — M549 Dorsalgia, unspecified: Secondary | ICD-10-CM | POA: Diagnosis not present

## 2016-06-27 LAB — POCT URINALYSIS DIP (MANUAL ENTRY)
Bilirubin, UA: NEGATIVE
Blood, UA: NEGATIVE
Glucose, UA: NEGATIVE
Ketones, POC UA: NEGATIVE
Leukocytes, UA: NEGATIVE
Nitrite, UA: NEGATIVE
PH UA: 6
PROTEIN UA: NEGATIVE
SPEC GRAV UA: 1.015
Urobilinogen, UA: 0.2

## 2016-06-27 LAB — POC MICROSCOPIC URINALYSIS (UMFC): Mucus: ABSENT

## 2016-06-27 MED ORDER — CYCLOBENZAPRINE HCL 10 MG PO TABS: 10.0000 mg | ORAL_TABLET | Freq: Three times a day (TID) | ORAL | 0 refills | Status: DC | PRN

## 2016-06-27 MED ORDER — MELOXICAM 7.5 MG PO TABS: 7.5000 mg | ORAL_TABLET | Freq: Every day | ORAL | 1 refills | Status: DC

## 2016-06-27 NOTE — Progress Notes (Signed)
Maria [name]  MRN: [id number] DOB: 16-Apr-1971  PCP: No PCP Per Patient  Subjective:  Pt is a 46 year old female PMH thyroid nodule who presents to clinic for back pain x 4 days. She speaks [place], she is here today with her son who is interpreting for her.  Pain is located in the middle of her mid-back. Does not radiate.  Learning over to the side hurts worse.  She has had this pain before - usually goes away after a day or two.  She works as a Scientist, forensic.  Massage makes it feel better. Has not tried heat or ice. Has tried Advil - helped some. She recently started jogging in place in the morning, this is helping some.  Denies numbness or tingling LES, decreased ROM, burning with urination, increased frequency, increased urgency.   Review of Systems  Constitutional: Negative for chills, fatigue and fever.  Respiratory: Negative for cough, shortness of breath and wheezing.   Cardiovascular: Negative for chest pain and palpitations.  Gastrointestinal: Negative for abdominal pain, diarrhea, nausea and vomiting.  Genitourinary: Negative for decreased urine volume, difficulty urinating, dysuria, enuresis, flank pain, frequency, hematuria and urgency.  Musculoskeletal: Positive for back pain.  Neurological: Negative for dizziness, weakness, light-headedness and headaches.    Patient Active Problem List   Diagnosis Date Noted  . Nocturia 05/23/2016  . Urinary frequency 05/16/2016  . Right thyroid nodule 03/28/2016    Current Outpatient Prescriptions on File Prior to Visit  Medication Sig Dispense Refill  . solifenacin (VESICARE) 5 MG tablet Take 5 mg by mouth daily.     No current facility-administered medications on file prior to visit.     No Known Allergies   Objective:  BP 110/70 (BP Location: Right Arm, Patient Position: Sitting, Cuff Size: Small)   Pulse 74   Temp 98.5 F (36.9 C) (Oral)   Resp 18   Ht 5' 2.5" (1.588 m)   Wt 113 lb (51.3 kg)   LMP 06/15/2016    SpO2 100%   BMI 20.34 kg/m   Physical Exam  Constitutional: She is oriented to person, place, and time and well-developed, well-nourished, and in no distress. No distress.  Cardiovascular: Normal rate, regular rhythm and normal heart sounds.   Abdominal: There is no CVA tenderness.  Musculoskeletal:       Thoracic back: She exhibits tenderness. She exhibits normal range of motion, no bony tenderness, no deformity and no spasm.  No decreased ROM. [name] TTP b/l.   Neurological: She is alert and oriented to person, place, and time. She has normal motor skills and normal strength. She has a normal Straight Leg Raise Test. GCS score is 15.  Skin: Skin is warm and dry.  Psychiatric: Mood, memory, affect and judgment normal.  Vitals reviewed.  Results for orders placed or performed in visit on 06/27/16  POCT urinalysis dipstick  Result Value Ref Range   Color, UA yellow yellow   Clarity, UA clear clear   Glucose, UA negative negative   Bilirubin, UA negative negative   Ketones, POC UA negative negative   Spec Grav, UA 1.015    Blood, UA negative negative   pH, UA 6.0    Protein Ur, POC negative negative   Urobilinogen, UA 0.2    Nitrite, UA Negative Negative   Leukocytes, UA Negative Negative   Results for orders placed or performed in visit on 06/27/16  POCT urinalysis dipstick  Result Value Ref Range   Color, UA yellow  yellow   Clarity, UA clear clear   Glucose, UA negative negative   Bilirubin, UA negative negative   Ketones, POC UA negative negative   Spec Grav, UA 1.015    Blood, UA negative negative   pH, UA 6.0    Protein Ur, POC negative negative   Urobilinogen, UA 0.2    Nitrite, UA Negative Negative   Leukocytes, UA Negative Negative  POCT Microscopic Urinalysis ([facility])  Result Value Ref Range   WBC,UR,HPF,POC None None WBC/hpf   RBC,UR,HPF,POC None None RBC/hpf   Bacteria None None, Too numerous to count   Mucus Absent Absent   Epithelial Cells, UR Per  Microscopy None None, Too numerous to count cells/hpf    Assessment and Plan :  1. Mid back pain 2. Other acute back pain - POCT urinalysis dipstick - POCT Microscopic Urinalysis ([facility]) - cyclobenzaprine (FLEXERIL) 10 MG tablet; Take 1 tablet (10 mg total) by mouth 3 (three) times daily as needed for muscle spasms.  Dispense: 30 tablet; Refill: 0 - meloxicam (MOBIC) 7.5 MG tablet; Take 1 tablet (7.5 mg total) by mouth daily. Max dose 15mg /day  Dispense: 30 tablet; Refill: 1 - Urine culture - Physical exam is unremarkable. Labs at this time do not reveal UTI. Culture pending. Encouraged continued exercising, massages. Add heat, stretches, hydration, proper body mechanics. Information regarding these instructions printed out for pt.  RTC in 4-6 weeks if no improvement, consider physical therapy referral.   Mercer Pod, PA-C  [facility] 06/27/2016 1:28 PM

## 2016-06-27 NOTE — Patient Instructions (Addendum)
Please drink plenty of water. Keeping your muscles hydrated will help your muscle tightness. Try to drink 2 liters/day.  Continue exercising - this will help relieve your back pain. Walk at least 20 min most days of the week.  Apply heat (buy a heating pad) to your back 20-30 min a time 2-3 times a day. Continue massaging.  Meloxicam 7.5 mg.   You may take '15mg'$  if 7.5 is not working -- you can take both at the same time, or one in the morning/one at night, etc. Max dose '15mg'$ /day. Do not take Advil/ibuprofen/other NSAIDs while taking this.  Flexeril is a muscle relaxer. This does not interact with Meloxicam. Please take this as directed. Take this at night before bed.  Please come back in 4-6 weeks if you are not better.   Thank you for coming in today. I hope you feel we met your needs.  Feel free to call [facility] if you have any questions or further requests.  Please consider signing up for MyChart if you do not already have it, as this is a great way to communicate with me.  Best,  Whitney McVey, PA-C  Low Back Strain Rehab Ask your health care provider which exercises are safe for you. Do exercises exactly as told by your health care provider and adjust them as directed. It is normal to feel mild stretching, pulling, tightness, or discomfort as you do these exercises, but you should stop right away if you feel sudden pain or your pain gets worse. Do not begin these exercises until told by your health care provider. Stretching and range of motion exercises These exercises warm up your muscles and joints and improve the movement and flexibility of your back. These exercises also help to relieve pain, numbness, and tingling. Exercise A: Single knee to chest   1. Lie on your back on a firm surface with both legs straight. 2. Bend one of your knees. Use your hands to move your knee up toward your chest until you feel a gentle stretch in your lower back and buttock.  Hold your leg in this position by  holding onto the front of your knee.  Keep your other leg as straight as possible. 3. Hold for __________ seconds. 4. Slowly return to the starting position. 5. Repeat with your other leg. Repeat __________ times. Complete this exercise __________ times a day. Exercise B: Prone extension on elbows   1. Lie on your abdomen on a firm surface. 2. Prop yourself up on your elbows. 3. Use your arms to help lift your chest up until you feel a gentle stretch in your abdomen and your lower back.  This will place some of your body weight on your elbows. If this is uncomfortable, try stacking pillows under your chest.  Your hips should stay down, against the surface that you are lying on. Keep your hip and back muscles relaxed. 4. Hold for __________ seconds. 5. Slowly relax your upper body and return to the starting position. Repeat __________ times. Complete this exercise __________ times a day. Strengthening exercises These exercises build strength and endurance in your back. Endurance is the ability to use your muscles for a long time, even after they get tired. Exercise C: Pelvic tilt  1. Lie on your back on a firm surface. Bend your knees and keep your feet flat. 2. Tense your abdominal muscles. Tip your pelvis up toward the ceiling and flatten your lower back into the floor.  To help with  this exercise, you may place a small towel under your lower back and try to push your back into the towel. 3. Hold for __________ seconds. 4. Let your muscles relax completely before you repeat this exercise. Repeat __________ times. Complete this exercise __________ times a day. Exercise D: Alternating arm and leg raises   1. Get on your hands and knees on a firm surface. If you are on a hard floor, you may want to use padding to cushion your knees, such as an exercise mat. 2. Line up your arms and legs. Your hands should be below your shoulders, and your knees should be below your hips. 3. Lift your  left leg behind you. At the same time, raise your right arm and straighten it in front of you.  Do not lift your leg higher than your hip.  Do not lift your arm higher than your shoulder.  Keep your abdominal and back muscles tight.  Keep your hips facing the ground.  Do not arch your back.  Keep your balance carefully, and do not hold your breath. 4. Hold for __________ seconds. 5. Slowly return to the starting position and repeat with your right leg and your left arm. Repeat __________ times. Complete this exercise __________times a day. Exercise J: Single leg lower with bent knees  1. Lie on your back on a firm surface. 2. Tense your abdominal muscles and lift your feet off the floor, one foot at a time, so your knees and hips are bent in an "L" shape (at about 90 degrees).  Your knees should be over your hips and your lower legs should be parallel to the floor. 3. Keeping your abdominal muscles tense and your knee bent, slowly lower one of your legs so your toe touches the ground. 4. Lift your leg back up to return to the starting position.  Do not hold your breath.  Do not let your back arch. Keep your back flat against the ground. 5. Repeat with your other leg. Repeat __________ times. Complete this exercise __________ times a day. Posture and body mechanics   Body mechanics refers to the movements and positions of your body while you do your daily activities. Posture is part of body mechanics. Good posture and healthy body mechanics can help to relieve stress in your body's tissues and joints. Good posture means that your spine is in its natural S-curve position (your spine is neutral), your shoulders are pulled back slightly, and your head is not tipped forward. The following are general guidelines for applying improved posture and body mechanics to your everyday activities. Standing    When standing, keep your spine neutral and your feet about hip-width apart. Keep a  slight bend in your knees. Your ears, shoulders, and hips should line up.  When you do a task in which you stand in one place for a long time, place one foot up on a stable object that is 2-4 inches (5-10 cm) high, such as a footstool. This helps keep your spine neutral. Sitting    When sitting, keep your spine neutral and keep your feet flat on the floor. Use a footrest, if necessary, and keep your thighs parallel to the floor. Avoid rounding your shoulders, and avoid tilting your head forward.  When working at a desk or a computer, keep your desk at a height where your hands are slightly lower than your elbows. Slide your chair under your desk so you are close enough to maintain good posture.  When working at a computer, place your monitor at a height where you are looking straight ahead and you do not have to tilt your head forward or downward to look at the screen. Resting    When lying down and resting, avoid positions that are most painful for you.  If you have pain with activities such as sitting, bending, stooping, or squatting (flexion-based activities), lie in a position in which your body does not bend very much. For example, avoid curling up on your side with your arms and knees near your chest (fetal position).  If you have pain with activities such as standing for a long time or reaching with your arms (extension-based activities), lie with your spine in a neutral position and bend your knees slightly. Try the following positions:  Lying on your side with a pillow between your knees.  Lying on your back with a pillow under your knees. Lifting    When lifting objects, keep your feet at least shoulder-width apart and tighten your abdominal muscles.  Bend your knees and hips and keep your spine neutral. It is important to lift using the strength of your legs, not your back. Do not lock your knees straight out.  Always ask for help to lift heavy or awkward objects. This  information is not intended to replace advice given to you by your health care provider. Make sure you discuss any questions you have with your health care provider. Document Released: 04/11/2005 Document Revised: 12/17/2015 Document Reviewed: 01/21/2015 Elsevier Interactive Patient Education  2017 [employer].   Back Exercises If you have pain in your back, do these exercises 2-3 times each day or as told by your doctor. When the pain goes away, do the exercises once each day, but repeat the steps more times for each exercise (do more repetitions). If you do not have pain in your back, do these exercises once each day or as told by your doctor. Exercises Single Knee to Chest   Do these steps 3-5 times in a row for each leg: 1. Lie on your back on a firm bed or the floor with your legs stretched out. 2. Bring one knee to your chest. 3. Hold your knee to your chest by grabbing your knee or thigh. 4. Pull on your knee until you feel a gentle stretch in your lower back. 5. Keep doing the stretch for 10-30 seconds. 6. Slowly let go of your leg and straighten it. Pelvic Tilt   Do these steps 5-10 times in a row: 1. Lie on your back on a firm bed or the floor with your legs stretched out. 2. Bend your knees so they point up to the ceiling. Your feet should be flat on the floor. 3. Tighten your lower belly (abdomen) muscles to press your lower back against the floor. This will make your tailbone point up to the ceiling instead of pointing down to your feet or the floor. 4. Stay in this position for 5-10 seconds while you gently tighten your muscles and breathe evenly. Cat-Cow   Do these steps until your lower back bends more easily: 1. Get on your hands and knees on a firm surface. Keep your hands under your shoulders, and keep your knees under your hips. You may put padding under your knees. 2. Let your head hang down, and make your tailbone point down to the floor so your lower back is round  like the back of a cat. 3. Stay in this position  for 5 seconds. 4. Slowly lift your head and make your tailbone point up to the ceiling so your back hangs low (sags) like the back of a cow. 5. Stay in this position for 5 seconds. Press-Ups   Do these steps 5-10 times in a row: 1. Lie on your belly (face-down) on the floor. 2. Place your hands near your head, about shoulder-width apart. 3. While you keep your back relaxed and keep your hips on the floor, slowly straighten your arms to raise the top half of your body and lift your shoulders. Do not use your back muscles. To make yourself more comfortable, you may change where you place your hands. 4. Stay in this position for 5 seconds. 5. Slowly return to lying flat on the floor. Bridges   Do these steps 10 times in a row: 1. Lie on your back on a firm surface. 2. Bend your knees so they point up to the ceiling. Your feet should be flat on the floor. 3. Tighten your butt muscles and lift your butt off of the floor until your waist is almost as high as your knees. If you do not feel the muscles working in your butt and the back of your thighs, slide your feet 1-2 inches farther away from your butt. 4. Stay in this position for 3-5 seconds. 5. Slowly lower your butt to the floor, and let your butt muscles relax. If this exercise is too easy, try doing it with your arms crossed over your chest. Belly Crunches   Do these steps 5-10 times in a row: 1. Lie on your back on a firm bed or the floor with your legs stretched out. 2. Bend your knees so they point up to the ceiling. Your feet should be flat on the floor. 3. Cross your arms over your chest. 4. Tip your chin a little bit toward your chest but do not bend your neck. 5. Tighten your belly muscles and slowly raise your chest just enough to lift your shoulder blades a tiny bit off of the floor. 6. Slowly lower your chest and your head to the floor. Back Lifts  Do these steps 5-10 times in  a row: 1. Lie on your belly (face-down) with your arms at your sides, and rest your forehead on the floor. 2. Tighten the muscles in your legs and your butt. 3. Slowly lift your chest off of the floor while you keep your hips on the floor. Keep the back of your head in line with the curve in your back. Look at the floor while you do this. 4. Stay in this position for 3-5 seconds. 5. Slowly lower your chest and your face to the floor. Contact a doctor if:  Your back pain gets a lot worse when you do an exercise.  Your back pain does not lessen 2 hours after you exercise. If you have any of these problems, stop doing the exercises. Do not do them again unless your doctor says it is okay. Get help right away if:  You have sudden, very bad back pain. If this happens, stop doing the exercises. Do not do them again unless your doctor says it is okay. This information is not intended to replace advice given to you by your health care provider. Make sure you discuss any questions you have with your health care provider. Document Released: 05/14/2010 Document Revised: 09/17/2015 Document Reviewed: 06/05/2014 Elsevier Interactive Patient Education  2017 [employer].   IF you received  an x-ray today, you will receive an invoice from [facility]. Please contact [facility] at [phone] with questions or concerns regarding your invoice.   IF you received labwork today, you will receive an invoice from [facility]. Please contact [facility] at [phone] with questions or concerns regarding your invoice.   Our billing staff will not be able to assist you with questions regarding bills from these companies.  You will be contacted with the lab results as soon as they are available. The fastest way to get your results is to activate your My Chart account. Instructions are located on the last page of this paperwork. If you have not heard from [place] regarding the results in 2 weeks, please  contact this office.

## 2016-06-29 LAB — URINE CULTURE

## 2016-07-31 ENCOUNTER — Emergency Department (HOSPITAL_COMMUNITY)
Admission: EM | Admit: 2016-07-31 | Discharge: 2016-07-31 | Disposition: A | Discharge status: Home / Self Care | Payer: BLUE CROSS/BLUE SHIELD | Attending: Emergency Medicine | Admitting: Emergency Medicine

## 2016-07-31 ENCOUNTER — Encounter (HOSPITAL_COMMUNITY): Payer: Self-pay | Admitting: Emergency Medicine

## 2016-07-31 ENCOUNTER — Emergency Department (HOSPITAL_COMMUNITY): Payer: BLUE CROSS/BLUE SHIELD

## 2016-07-31 DIAGNOSIS — Z79899 Other long term (current) drug therapy: Secondary | ICD-10-CM | POA: Diagnosis not present

## 2016-07-31 DIAGNOSIS — R339 Retention of urine, unspecified: Secondary | ICD-10-CM | POA: Diagnosis not present

## 2016-07-31 DIAGNOSIS — R109 Unspecified abdominal pain: Secondary | ICD-10-CM | POA: Diagnosis present

## 2016-07-31 DIAGNOSIS — D259 Leiomyoma of uterus, unspecified: Secondary | ICD-10-CM | POA: Diagnosis not present

## 2016-07-31 LAB — COMPREHENSIVE METABOLIC PANEL
ALK PHOS: 56 U/L (ref 38–126)
ALT: 16 U/L (ref 14–54)
AST: 23 U/L (ref 15–41)
Albumin: 4.3 g/dL (ref 3.5–5.0)
Anion gap: 6 (ref 5–15)
BUN: 10 mg/dL (ref 6–20)
CALCIUM: 9.2 mg/dL (ref 8.9–10.3)
CO2: 25 mmol/L (ref 22–32)
CREATININE: 0.65 mg/dL (ref 0.44–1.00)
Chloride: 104 mmol/L (ref 101–111)
Glucose, Bld: 106 mg/dL — ABNORMAL HIGH (ref 65–99)
Potassium: 3.6 mmol/L (ref 3.5–5.1)
Sodium: 135 mmol/L (ref 135–145)
Total Bilirubin: 0.9 mg/dL (ref 0.3–1.2)
Total Protein: 8 g/dL (ref 6.5–8.1)

## 2016-07-31 LAB — URINALYSIS, ROUTINE W REFLEX MICROSCOPIC
Bilirubin Urine: NEGATIVE
GLUCOSE, UA: NEGATIVE mg/dL
KETONES UR: NEGATIVE mg/dL
LEUKOCYTES UA: NEGATIVE
Nitrite: NEGATIVE
PH: 5 (ref 5.0–8.0)
Protein, ur: NEGATIVE mg/dL
SPECIFIC GRAVITY, URINE: 1.009 (ref 1.005–1.030)

## 2016-07-31 LAB — WET PREP, GENITAL
Clue Cells Wet Prep HPF POC: NONE SEEN
Sperm: NONE SEEN
TRICH WET PREP: NONE SEEN
Yeast Wet Prep HPF POC: NONE SEEN

## 2016-07-31 LAB — I-STAT BETA HCG BLOOD, ED (MC, WL, AP ONLY): I-stat hCG, quantitative: <5 m[IU]/mL (ref <5)

## 2016-07-31 LAB — CBC
HCT: 37.4 % (ref 36.0–46.0)
Hemoglobin: 12.6 g/dL (ref 12.0–15.0)
MCH: 32.1 pg (ref 26.0–34.0)
MCHC: 33.7 g/dL (ref 30.0–36.0)
MCV: 95.4 fL (ref 78.0–100.0)
PLATELETS: 213 10*3/uL (ref 150–400)
RBC: 3.92 MIL/uL (ref 3.87–5.11)
RDW: 12.9 % (ref 11.5–15.5)
WBC: 14.4 10*3/uL — AB (ref 4.0–10.5)

## 2016-07-31 LAB — LIPASE, BLOOD: Lipase: 21 U/L (ref 11–51)

## 2016-07-31 MED ORDER — IOPAMIDOL (ISOVUE-300) INJECTION 61%
INTRAVENOUS | Status: AC
  Filled 2016-07-31: qty 100

## 2016-07-31 MED ORDER — IOPAMIDOL (ISOVUE-300) INJECTION 61%
INTRAVENOUS | Status: AC
  Administered 2016-07-31: 100 mL via INTRAVENOUS
  Filled 2016-07-31: qty 100

## 2016-07-31 MED ORDER — MORPHINE SULFATE (PF) 2 MG/ML IV SOLN: 4.0000 mg | Freq: Once | INTRAVENOUS | Status: DC

## 2016-07-31 MED ORDER — IOPAMIDOL (ISOVUE-300) INJECTION 61%
INTRAVENOUS | Status: AC
  Administered 2016-07-31: 30 mL via ORAL
  Filled 2016-07-31: qty 30

## 2016-07-31 NOTE — ED Notes (Signed)
Bladder scan showed that the patient has 766 ml of urine in her bladder.

## 2016-07-31 NOTE — Discharge Instructions (Signed)
Discontinue Toviaz and Mybetriq. Call the women[facility] outpatient clinic tomorrow to arrange to be seen this week. Tell office staff that [place] spoke with Dr.Eure about your case and that you need to be seen this week. Return if concern for any reason

## 2016-07-31 NOTE — ED Provider Notes (Signed)
[facility] DEPT Provider Note   CSN: [id number] Arrival date & time: 07/31/16  1337     History   Chief Complaint Chief Complaint  Patient presents with  . Abdominal Pain  History the patient's teenage daughter acting as interpreter. We attempted to get professional medical interpreter. None available. Machine malfunction.  HPI Maria [name] is a 46 y.o. female. Complains of infraumbilical abdominal pain nonradiating onset last night. Gradual. She feels as if she has a difficult time emptying her bladder. She was seen at an urgent care center earlier today. Treated with Toradol IM with temporary relief. Also received prescriptions forMybetriq and Toviaz which she's taken without relief. Last bowel movement yesterday, slight amount. No anorexia no fever. Pain worse with attempting to urinate. No back pain. No focal numbness or weakness. No nausea or vomiting. No other associated symptoms.  HPI  History reviewed. No pertinent past medical history.  Patient Active Problem List   Diagnosis Date Noted  . Nocturia 05/23/2016  . Urinary frequency 05/16/2016  . Right thyroid nodule 03/28/2016    Past Surgical History:  Procedure Laterality Date  . RHINOPLASTY      OB History    No data available       Home Medications    Prior to Admission medications   Medication Sig Start Date End Date Taking? Authorizing Provider  cyclobenzaprine (FLEXERIL) 10 MG tablet Take 1 tablet (10 mg total) by mouth 3 (three) times daily as needed for muscle spasms. 06/27/16   Elizabeth Whitney McVey, PA-C  meloxicam (MOBIC) 7.5 MG tablet Take 1 tablet (7.5 mg total) by mouth daily. Max dose 15mg /day 06/27/16   Gelene Mink McVey, PA-C  solifenacin (VESICARE) 5 MG tablet Take 5 mg by mouth daily. 05/16/16   Bjorn Loser, MD    Family History Family History  Problem Relation Age of Onset  . Hypertension Father     Social History Social History  Substance Use Topics  . Smoking status:  Never Smoker  . Smokeless tobacco: Never Used  . Alcohol use No     Allergies   Patient has no known allergies.   Review of Systems Review of Systems  Constitutional: Negative.   HENT: Negative.   Respiratory: Negative.   Cardiovascular: Negative.   Gastrointestinal: Positive for abdominal pain.  Genitourinary: Positive for dysuria.  Musculoskeletal: Negative.   Skin: Negative.   Neurological: Negative.   Psychiatric/Behavioral: Negative.   All other systems reviewed and are negative.    Physical Exam Updated Vital Signs BP (!) 160/96 (BP Location: Left Arm)   Pulse (!) 102   Temp 97.8 F (36.6 C) (Oral)   Resp 18   Ht 5\' 2"  (1.575 m)   Wt 108 lb 12.8 oz (49.4 kg)   SpO2 99%   BMI 19.90 kg/m   Physical Exam  Constitutional: She is oriented to person, place, and time. She appears well-developed and well-nourished.  Appears mildly uncomfortable  HENT:  Head: Normocephalic and atraumatic.  Eyes: Conjunctivae are normal. Pupils are equal, round, and reactive to light.  Neck: Neck supple. No tracheal deviation present. No thyromegaly present.  Cardiovascular: Normal rate and regular rhythm.   No murmur heard. Pulmonary/Chest: Effort normal and breath sounds normal.  Abdominal: Soft. Bowel sounds are normal. She exhibits no distension. There is tenderness.  Tender at infraumbilical area  Genitourinary:  Genitourinary Comments: Pelvic exam no external lesion. Cervical os closed. No cervical motion tenderness no adnexal tenderness. Uterine fundus firm and tender  Musculoskeletal:  Normal range of motion. She exhibits no edema or tenderness.  Neurological: She is alert and oriented to person, place, and time. Coordination normal.  Skin: Skin is warm and dry. No rash noted.  Psychiatric: She has a normal mood and affect.  Nursing note and vitals reviewed.  Results for orders placed or performed during the hospital encounter of 07/31/16  Wet prep, genital  Result  Value Ref Range   Yeast Wet Prep HPF POC NONE SEEN NONE SEEN   Trich, Wet Prep NONE SEEN NONE SEEN   Clue Cells Wet Prep HPF POC NONE SEEN NONE SEEN   WBC, Wet Prep HPF POC FEW (A) NONE SEEN   Sperm NONE SEEN   Lipase, blood  Result Value Ref Range   Lipase 21 11 - 51 U/L  Comprehensive metabolic panel  Result Value Ref Range   Sodium 135 135 - 145 mmol/L   Potassium 3.6 3.5 - 5.1 mmol/L   Chloride 104 101 - 111 mmol/L   CO2 25 22 - 32 mmol/L   Glucose, Bld 106 (H) 65 - 99 mg/dL   BUN 10 6 - 20 mg/dL   Creatinine, Ser 0.65 0.44 - 1.00 mg/dL   Calcium 9.2 8.9 - 10.3 mg/dL   Total Protein 8.0 6.5 - 8.1 g/dL   Albumin 4.3 3.5 - 5.0 g/dL   AST 23 15 - 41 U/L   ALT 16 14 - 54 U/L   Alkaline Phosphatase 56 38 - 126 U/L   Total Bilirubin 0.9 0.3 - 1.2 mg/dL   GFR calc non Af Amer >60 >60 mL/min   GFR calc Af Amer >60 >60 mL/min   Anion gap 6 5 - 15  CBC  Result Value Ref Range   WBC 14.4 (H) 4.0 - 10.5 K/uL   RBC 3.92 3.87 - 5.11 MIL/uL   Hemoglobin 12.6 12.0 - 15.0 g/dL   HCT 37.4 36.0 - 46.0 %   MCV 95.4 78.0 - 100.0 fL   MCH 32.1 26.0 - 34.0 pg   MCHC 33.7 30.0 - 36.0 g/dL   RDW 12.9 11.5 - 15.5 %   Platelets 213 150 - 400 K/uL  Urinalysis, Routine w reflex microscopic  Result Value Ref Range   Color, Urine YELLOW YELLOW   APPearance CLEAR CLEAR   Specific Gravity, Urine 1.009 1.005 - 1.030   pH 5.0 5.0 - 8.0   Glucose, UA NEGATIVE NEGATIVE mg/dL   Hgb urine dipstick SMALL (A) NEGATIVE   Bilirubin Urine NEGATIVE NEGATIVE   Ketones, ur NEGATIVE NEGATIVE mg/dL   Protein, ur NEGATIVE NEGATIVE mg/dL   Nitrite NEGATIVE NEGATIVE   Leukocytes, UA NEGATIVE NEGATIVE   RBC / HPF 6-30 0 - 5 RBC/hpf   WBC, UA 0-5 0 - 5 WBC/hpf   Bacteria, UA RARE (A) NONE SEEN   Squamous Epithelial / LPF 0-5 (A) NONE SEEN   Mucous PRESENT   I-Stat beta hCG blood, ED  Result Value Ref Range   I-stat hCG, quantitative <5.0 <5 mIU/mL   Comment 3           Ct Abdomen Pelvis W  Contrast  Result Date: 07/31/2016 CLINICAL DATA:  Onset of pain without emesis or diarrhea. Urinary retention. EXAM: CT ABDOMEN AND PELVIS WITH CONTRAST TECHNIQUE: Multidetector CT imaging of the abdomen and pelvis was performed using the standard protocol following bolus administration of intravenous contrast. CONTRAST:  ISOVUE-300 IOPAMIDOL (ISOVUE-300) INJECTION 61%, 1 ISOVUE-300 IOPAMIDOL (ISOVUE-300) INJECTION 61% COMPARISON:  None. FINDINGS: Lower chest: Normal  size cardiac chambers. No pericardial effusion. Clear lung bases. Hepatobiliary: No focal liver abnormality is seen. No gallstones, gallbladder wall thickening, or biliary dilatation. Pancreas: Unremarkable. No pancreatic ductal dilatation or surrounding inflammatory changes. Spleen: Normal in size without focal abnormality. Adrenals/Urinary Tract: Normal bilateral adrenal glands and kidneys. Mild ectasia of the renal collecting systems bilaterally without hydroureter is nor obstructive uropathy. Foley catheter is seen within the bladder likely accounts for the air-fluid level noted from instrumentation. Stomach/Bowel: Contrast distended stomach. Normal small bowel rotation without bowel inflammation or distention. Moderate colonic stool burden is seen normal-appearing appendix. No large bowel obstruction. Vascular/Lymphatic: Aortic atherosclerosis. No enlarged abdominal or pelvic lymph nodes. Reproductive: Leiomyomas of the uterus are identified, several of which are subserosal in appearance, the largest is heterogeneously enhancing on the left off the fundus measuring approximately 4.4 x 3.7 x 3.9 cm. A posterior intramural and slightly subserosal lower uterine segment fibroid measuring 3.5 x 2.8 x 2.3 cm is also noted. A hyperdense intramural anterior 1 cm leiomyoma is noted with a 2 cm hypodense intramural though slightly subserosal right-sided leiomyoma. A pair of 1 cm anterior intramural leiomyomas are also noted, best identified on the sagittal  reformats. No apparent submucosal leiomyoma. Other: No abdominal wall hernia or abnormality. No abdominopelvic ascites. Musculoskeletal: No acute or significant osseous findings. IMPRESSION: 1. Numerous leiomyomas of the uterus, the largest is left-sided and subserosal measuring 4.4 x 3.7 x 3.9 cm with a posterior intramural and slightly subserosal 3.5 x 2.8 x 2.3 cm leiomyoma in the lower uterine segment. Smaller subserosal and intramural leiomyomas as above described. No submucosal appearing leiomyoma. 2. No acute bowel inflammation or obstruction is noted. Electronically Signed   By: Ashley Royalty M.D.   On: 07/31/2016 18:55     ED Treatments / Results  Labs (all labs ordered are listed, but only abnormal results are displayed) Labs Reviewed  COMPREHENSIVE METABOLIC PANEL - Abnormal; Notable for the following:       Result Value   Glucose, Bld 106 (*)    All other components within normal limits  CBC - Abnormal; Notable for the following:    WBC 14.4 (*)    All other components within normal limits  URINALYSIS, ROUTINE W REFLEX MICROSCOPIC - Abnormal; Notable for the following:    Hgb urine dipstick SMALL (*)    Bacteria, UA RARE (*)    Squamous Epithelial / LPF 0-5 (*)    All other components within normal limits  WET PREP, GENITAL  LIPASE, BLOOD  RPR  HIV ANTIBODY (ROUTINE TESTING)  I-STAT BETA HCG BLOOD, ED ([facility], [facility], [facility] ONLY)  GC/CHLAMYDIA PROBE AMP ([facility]) NOT AT [facility]    EKG  EKG Interpretation None       Radiology No results found.  Procedures Procedures (including critical care time)  Medications Ordered in ED Medications  morphine 2 MG/ML injection 4 mg (not administered)     Initial Impression / Assessment and Plan / ED Course  I have reviewed the triage vital signs and the nursing notes.  Pertinent labs & imaging results that were available during my care of the patient were reviewed by me and considered in my medical decision making (see chart for  details).     Bladder scan showed patient 766 mL of urine in bladder. Foley catheter was inserted by nurse, and patient's discomfort improved within a few minutes As of 7:40 PM patient had greater than 1500 mL of urine in Foley bag. I consulted Dr.Eure via  telephone. In discussion with Dr.Eure Leiomyomas or possible cause of urinary retention which he feels is likely acute on chronic. Other possibilities or diabetes or multiple sclerosis, though patient has no focal neurologic complaint, no visual changes. Grossly normal neurologic exam. Essentially normal blood sugar. Plan stop Mybetriq and Toviaz. Referral to women's outpatient clinic. Foley catheter can be discontinued prior to discharge  Final Clinical Impressions(s) / ED Diagnoses  Diagnosis #1 urinary retention #2 uterine fibroids Final diagnoses:  None    New Prescriptions New Prescriptions   No medications on file     Orlie Dakin, MD 07/31/16 1952

## 2016-07-31 NOTE — ED Triage Notes (Signed)
Pt c/o pain onset today about 0500. No emesis or diarrhea.

## 2016-08-01 LAB — GC/CHLAMYDIA PROBE AMP (~~LOC~~) NOT AT ARMC
Chlamydia: NEGATIVE
Neisseria Gonorrhea: NEGATIVE

## 2016-08-02 LAB — HIV ANTIBODY (ROUTINE TESTING W REFLEX): HIV Screen 4th Generation wRfx: NONREACTIVE

## 2016-08-02 LAB — RPR: RPR Ser Ql: NONREACTIVE

## 2016-08-03 ENCOUNTER — Ambulatory Visit (INDEPENDENT_AMBULATORY_CARE_PROVIDER_SITE_OTHER): Payer: BLUE CROSS/BLUE SHIELD | Admitting: Obstetrics & Gynecology

## 2016-08-03 ENCOUNTER — Other Ambulatory Visit: Payer: Self-pay | Admitting: Obstetrics & Gynecology

## 2016-08-03 ENCOUNTER — Encounter: Payer: Self-pay | Admitting: Obstetrics & Gynecology

## 2016-08-03 VITALS — BP 147/96 | HR 77 | Ht 62.0 in | Wt 118.5 lb

## 2016-08-03 DIAGNOSIS — Z124 Encounter for screening for malignant neoplasm of cervix: Secondary | ICD-10-CM

## 2016-08-03 DIAGNOSIS — Z0000 Encounter for general adult medical examination without abnormal findings: Secondary | ICD-10-CM

## 2016-08-03 DIAGNOSIS — D259 Leiomyoma of uterus, unspecified: Secondary | ICD-10-CM | POA: Insufficient documentation

## 2016-08-03 DIAGNOSIS — N812 Incomplete uterovaginal prolapse: Secondary | ICD-10-CM

## 2016-08-03 DIAGNOSIS — Z1151 Encounter for screening for human papillomavirus (HPV): Secondary | ICD-10-CM | POA: Diagnosis not present

## 2016-08-03 DIAGNOSIS — Z1231 Encounter for screening mammogram for malignant neoplasm of breast: Secondary | ICD-10-CM

## 2016-08-03 NOTE — Progress Notes (Signed)
   Subjective:    Patient ID: Maria [name], female    DOB: [birth year], 46 y.o.   MRN: [id number]  HPI 46 yo M A P2 here for follow up after a visit to the ER for abdominal pain which was found to be caused by urinary retention. 768 mL of urine was drained via Foley. She was told to stop her meds for urinary frequency. She had been seen by [facility] earlier and given an anticholinergic medicine for her urge incontinence/urinary frequency.  A CT was done during that ER visit that showed a few fibroids (about 4 cm).  She was then referred here.  Once she stopped the frequency meds, she has been able to void without retention and her pain resolved.  She uses condoms but doesn't want more kids. She reports regular monthly periods and reports only occasional dyspareunia. Her recent HBG was 12.6.   Review of Systems     Objective:   Physical Exam [id number] Breathing, conversing, and ambulating normally Translator used Abd- thin, benign 2nd uterine prolapse, normal cervix 6 week size uterus, posterior fibroid palpable, some mobility, reasonable pelvic arch for vaginal approach for hysterectomy prn     Assessment & Plan:  Preventative care- pap and mammogram Urge incontinence/urinary frequency- refer back to [facility] Fibroids- basically asymptomatic. However, if she would like a hysterectomy, then I will do it for her.

## 2016-08-05 LAB — CYTOLOGY - PAP
Diagnosis: NEGATIVE
HPV: NOT DETECTED

## 2016-08-22 ENCOUNTER — Ambulatory Visit: Payer: BLUE CROSS/BLUE SHIELD

## 2016-12-09 ENCOUNTER — Ambulatory Visit (INDEPENDENT_AMBULATORY_CARE_PROVIDER_SITE_OTHER): Payer: BLUE CROSS/BLUE SHIELD | Admitting: Family Medicine

## 2016-12-09 ENCOUNTER — Encounter: Payer: Self-pay | Admitting: Family Medicine

## 2016-12-09 VITALS — BP 121/76 | HR 80 | Temp 98.2°F | Resp 16 | Ht 62.0 in | Wt 114.0 lb

## 2016-12-09 DIAGNOSIS — L03213 Periorbital cellulitis: Secondary | ICD-10-CM | POA: Diagnosis not present

## 2016-12-09 MED ORDER — CLINDAMYCIN HCL 300 MG PO CAPS: 300.0000 mg | ORAL_CAPSULE | Freq: Three times a day (TID) | ORAL | 0 refills | Status: AC

## 2016-12-09 NOTE — Patient Instructions (Addendum)
     IF you received an x-ray today, you will receive an invoice from [facility]. Please contact [facility] at [phone] with questions or concerns regarding your invoice.   IF you received labwork today, you will receive an invoice from [facility]. Please contact [facility] at [phone] with questions or concerns regarding your invoice.   Our billing staff will not be able to assist you with questions regarding bills from these companies.  You will be contacted with the lab results as soon as they are available. The fastest way to get your results is to activate your My Chart account. Instructions are located on the last page of this paperwork. If you have not heard from us regarding the results in 2 weeks, please contact this office.     

## 2016-12-09 NOTE — Progress Notes (Signed)
   8/17/201812:34 PM  Maria [name] 11-May-1970, 46 y.o. female [id number]  Chief Complaint  Patient presents with  . Eye Pain    Left eye, Swelling, Itchy    HPI:  An interpreter was used for this visit.  Patient is a 46 y.o. female who presents today for 2 days of left lower eyelid swelling and pain. 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 Then this morning she noticed pain and swelling in her lower eyelid. She denies anything splashing or flying into her eye. She reports mild blurry vision. She denies photophobia or eye pain movement, though movement causes eyelid pain. She denies any redness of her eye or discharge. She denies any fever or chills, recent URI or allergy sx.   Depression screen [facility] 2/9 12/09/2016 06/27/2016 08/05/2015  Decreased Interest 0 0 1  Down, Depressed, Hopeless 0 0 2  PHQ - 2 Score 0 0 3  Altered sleeping - - 1  Tired, decreased energy - - 0  Change in appetite - - 0  Feeling bad or failure about yourself  - - 0  Trouble concentrating - - 0  Moving slowly or fidgety/restless - - 0  Suicidal thoughts - - 0  PHQ-9 Score - - 4  Difficult doing work/chores - - Not difficult at all    No Known Allergies  No current outpatient prescriptions on file prior to visit.   No current facility-administered medications on file prior to visit.     No past medical history on file.  Past Surgical History:  Procedure Laterality Date  . RHINOPLASTY      Social History  Substance Use Topics  . Smoking status: Never Smoker  . Smokeless tobacco: Never Used  . Alcohol use Yes     Comment: occasionally    Family History  Problem Relation Age of Onset  . Hypertension Father     Review of Systems  Constitutional: Negative for chills, fever and malaise/fatigue.  HENT: Negative for congestion, ear pain, sinus pain and sore throat.   Eyes: Positive for blurred vision and pain.  Negative for double vision, photophobia, discharge and redness.  Respiratory: Negative for cough and shortness of breath.      OBJECTIVE:  Vitals:   12/09/16 1125  Weight: 114 lb (51.7 kg)  Height: 5\' 2"  (1.575 m)    Physical Exam  Constitutional: She is well-developed, well-nourished, and in no distress.  HENT:  Head: Normocephalic and atraumatic.  Eyes: Pupils are equal, round, and reactive to light. Conjunctivae and EOM are normal. Lids are everted and swept, no foreign bodies found. Right eye exhibits no discharge. Left eye exhibits no discharge.      ASSESSMENT and PLAN:  1. Preseptal cellulitis of left lower eyelid Exam concerning for preseptal cellulitis given normal conjunctiva and no h/o FB. Discussed antibiotic r/se/b. Discussed use of OTC probiotics while taking abx. RTC precautions given.   Meds ordered this encounter  Medications  . clindamycin (CLEOCIN) 300 MG capsule    Sig: Take 1 capsule (300 mg total) by mouth 3 (three) times daily.    Dispense:  30 capsule    Refill:  Dawson, MD [facility], [place] [id number] Ph.  [phone] Fax [phone]

## 2016-12-12 ENCOUNTER — Encounter: Payer: Self-pay | Admitting: Emergency Medicine

## 2016-12-12 ENCOUNTER — Ambulatory Visit (INDEPENDENT_AMBULATORY_CARE_PROVIDER_SITE_OTHER): Payer: BLUE CROSS/BLUE SHIELD | Admitting: Emergency Medicine

## 2016-12-12 DIAGNOSIS — L03213 Periorbital cellulitis: Secondary | ICD-10-CM | POA: Insufficient documentation

## 2016-12-12 MED ORDER — ERYTHROMYCIN 5 MG/GM OP OINT: 1.0000 "application " | TOPICAL_OINTMENT | Freq: Two times a day (BID) | OPHTHALMIC | 0 refills | Status: AC

## 2016-12-12 NOTE — Patient Instructions (Addendum)
     IF you received an x-ray today, you will receive an invoice from [facility]. Please contact [facility] at [phone] with questions or concerns regarding your invoice.   IF you received labwork today, you will receive an invoice from [facility]. Please contact [facility] at [phone] with questions or concerns regarding your invoice.   Our billing staff will not be able to assist you with questions regarding bills from these companies.  You will be contacted with the lab results as soon as they are available. The fastest way to get your results is to activate your My Chart account. Instructions are located on the last page of this paperwork. If you have not heard from us regarding the results in 2 weeks, please contact this office.      Cellulitis, Adult Cellulitis is a skin infection. The infected area is usually red and sore. This condition occurs most often in the arms and lower legs. It is very important to get treated for this condition. Follow these instructions at home:  Take over-the-counter and prescription medicines only as told by your doctor.  If you were prescribed an antibiotic medicine, take it as told by your doctor. Do not stop taking the antibiotic even if you start to feel better.  Drink enough fluid to keep your pee (urine) clear or pale yellow.  Do not touch or rub the infected area.  Raise (elevate) the infected area above the level of your heart while you are sitting or lying down.  Place warm or cold wet cloths (warm or cold compresses) on the infected area. Do this as told by your doctor.  Keep all follow-up visits as told by your doctor. This is important. These visits let your doctor make sure your infection is not getting worse. Contact a doctor if:  You have a fever.  Your symptoms do not get better after 1-2 days of treatment.  Your bone or joint under the infected area starts to hurt after the skin has healed.  Your  infection comes back. This can happen in the same area or another area.  You have a swollen bump in the infected area.  You have new symptoms.  You feel ill and also have muscle aches and pains. Get help right away if:  Your symptoms get worse.  You feel very sleepy.  You throw up (vomit) or have watery poop (diarrhea) for a long time.  There are red streaks coming from the infected area.  Your red area gets larger.  Your red area turns darker. This information is not intended to replace advice given to you by your health care provider. Make sure you discuss any questions you have with your health care provider. Document Released: 09/28/2007 Document Revised: 09/17/2015 Document Reviewed: 02/18/2015 Elsevier Interactive Patient Education  2018 [employer]  

## 2016-12-12 NOTE — Progress Notes (Signed)
Maria [name] 46 y.o.   Chief Complaint  Patient presents with  . Eye Problem    f/u- left  eyelid    HISTORY OF PRESENT ILLNESS: This is a 46 y.o. female here for follow up of left eye preseptal cellulitis; on Clindamycin; developed pustule.  HPI   Prior to Admission medications   Medication Sig Start Date End Date Taking? Authorizing Provider  clindamycin (CLEOCIN) 300 MG capsule Take 1 capsule (300 mg total) by mouth 3 (three) times daily. 12/09/16 12/19/16 Yes Rutherford Guys, MD    No Known Allergies  Patient Active Problem List   Diagnosis Date Noted  . Uterine leiomyoma 08/03/2016  . Nocturia 05/23/2016  . Urinary frequency 05/16/2016  . Right thyroid nodule 03/28/2016    History reviewed. No pertinent past medical history.  Past Surgical History:  Procedure Laterality Date  . RHINOPLASTY      Social History   Social History  . Marital status: Divorced    Spouse name: N/A  . Number of children: N/A  . Years of education: N/A   Occupational History  . Not on file.   Social History Main Topics  . Smoking status: Never Smoker  . Smokeless tobacco: Never Used  . Alcohol use Yes     Comment: occasionally  . Drug use: No  . Sexual activity: Yes    Birth control/ protection: None   Other Topics Concern  . Not on file   Social History Narrative   Marital status: separated since 2014.  Not dating.      Children:  2 children (18, 70)      Lives: with 2 children.      Employment:  Insurance claims handler; full time      Tobacco: none      Alcohol: none      Drugs: none      Exercise: none    Family History  Problem Relation Age of Onset  . Hypertension Father      Review of Systems  Constitutional: Negative for chills and fever.  Eyes: Negative for blurred vision, double vision, pain, discharge and redness.     Physical Exam  Constitutional: She appears well-developed and well-nourished.  HENT:  Head: Normocephalic and atraumatic.  Eyes: Pupils are  equal, round, and reactive to light. Conjunctivae and EOM are normal.    Neck: Normal range of motion.  Cardiovascular: Normal rate.   Pulmonary/Chest: Effort normal.  Musculoskeletal: Normal range of motion.  Skin: Skin is warm and dry. Capillary refill takes less than 2 seconds. No rash noted.  Psychiatric: She has a normal mood and affect. Her behavior is normal.  Vitals reviewed.  Preseptal cellulitis of left lower eyelid Improving; advised to use warm compresses and continue oral antibiotic; will add topical antibiotic.    ASSESSMENT & PLAN: [name] was seen today for eye problem.  Diagnoses and all orders for this visit:  Preseptal cellulitis of left lower eyelid Comments: improving  Other orders -     erythromycin ophthalmic ointment; Place 1 application into the left eye 2 times daily at 12 noon and 4 pm.    Patient Instructions       IF you received an x-ray today, you will receive an invoice from [facility]. Please contact [facility] at [phone] with questions or concerns regarding your invoice.   IF you received labwork today, you will receive an invoice from [place]. Please contact [facility] at [phone] with questions or concerns regarding your invoice.  Our billing staff will not be able to assist you with questions regarding bills from these companies.  You will be contacted with the lab results as soon as they are available. The fastest way to get your results is to activate your My Chart account. Instructions are located on the last page of this paperwork. If you have not heard from [place] regarding the results in 2 weeks, please contact this office.     Cellulitis, Adult Cellulitis is a skin infection. The infected area is usually red and sore. This condition occurs most often in the arms and lower legs. It is very important to get treated for this condition. Follow these instructions at home:  Take over-the-counter and  prescription medicines only as told by your doctor.  If you were prescribed an antibiotic medicine, take it as told by your doctor. Do not stop taking the antibiotic even if you start to feel better.  Drink enough fluid to keep your pee (urine) clear or pale yellow.  Do not touch or rub the infected area.  Raise (elevate) the infected area above the level of your heart while you are sitting or lying down.  Place warm or cold wet cloths (warm or cold compresses) on the infected area. Do this as told by your doctor.  Keep all follow-up visits as told by your doctor. This is important. These visits let your doctor make sure your infection is not getting worse. Contact a doctor if:  You have a fever.  Your symptoms do not get better after 1-2 days of treatment.  Your bone or joint under the infected area starts to hurt after the skin has healed.  Your infection comes back. This can happen in the same area or another area.  You have a swollen bump in the infected area.  You have new symptoms.  You feel ill and also have muscle aches and pains. Get help right away if:  Your symptoms get worse.  You feel very sleepy.  You throw up (vomit) or have watery poop (diarrhea) for a long time.  There are red streaks coming from the infected area.  Your red area gets larger.  Your red area turns darker. This information is not intended to replace advice given to you by your health care provider. Make sure you discuss any questions you have with your health care provider. Document Released: 09/28/2007 Document Revised: 09/17/2015 Document Reviewed: 02/18/2015 Elsevier Interactive Patient Education  2018 Elsevier Inc.      Agustina Caroli, MD [facility]

## 2016-12-12 NOTE — Assessment & Plan Note (Signed)
Improving; advised to use warm compresses and continue oral antibiotic; will add topical antibiotic.

## 2016-12-15 ENCOUNTER — Encounter: Payer: Self-pay | Admitting: Emergency Medicine

## 2016-12-15 ENCOUNTER — Ambulatory Visit (INDEPENDENT_AMBULATORY_CARE_PROVIDER_SITE_OTHER): Payer: BLUE CROSS/BLUE SHIELD | Admitting: Emergency Medicine

## 2016-12-15 VITALS — BP 113/70 | HR 60 | Temp 98.1°F | Resp 16 | Ht 62.0 in | Wt 114.0 lb

## 2016-12-15 DIAGNOSIS — L03213 Periorbital cellulitis: Secondary | ICD-10-CM | POA: Diagnosis not present

## 2016-12-15 NOTE — Patient Instructions (Addendum)
We recommend that you schedule a mammogram for breast cancer screening. Typically, you do not need a referral to do this. Please contact a local imaging center to schedule your mammogram.  [facility] - [phone]  *ask for the Radiology Department [facility] ([place]) - [phone] or [phone]  [facility] - [phone] [place] [phone] [facility] - [phone]  *ask for the [facility] - [phone]  *ask for the Radiology Department [facility] - [phone]  *ask for the [place] - [phone]    IF you received an x-ray today, you will receive an invoice from [facility]. Please contact [facility] at [phone] with questions or concerns regarding your invoice.   IF you received labwork today, you will receive an invoice from [place]. Please contact [facility] at [phone] with questions or concerns regarding your invoice.   Our billing staff will not be able to assist you with questions regarding bills from these companies.  You will be contacted with the lab results as soon as they are available. The fastest way to get your results is to activate your My Chart account. Instructions are located on the last page of this paperwork. If you have not heard from [place] regarding the results in 2 weeks, please contact this office.     Vim m t? ba?o, Ng???i l??n (Cellulitis, Adult) Vim m t? ba?o la? m?t b?nh nhi?m tru?ng da. Vng nhi?m trng th??ng ?? v nh?y c?m ?au. Tnh tr?ng na?y xu?t hi?n th??ng xuyn nh?t ? cnh tay v c?ng chn. Nhi?m tru?ng co? th? lan ??n ca?c c?, ma?u va? m bn d???i va? tr?? nn tr?m tro?ng. M?t ?i?u r?t quan tro?ng la? pha?i ?i?u tri? tnh tr?ng na?y. NGUYN NHN Vim m t? ba?o do vi khu?n gy ra. Vi khu?n xm nh?p qua ch? ??t trn da, ch??ng ha?n nh?  v?t c??t, v?t bo?ng, v?t cn tru?ng ??t, v?t loe?t h?? ho??c v?t n??t. CC Y?U T? NGUY C? Tnh tr?ng ny hay x?y ra h?n ? nh?ng ng??i:  C h? th?ng b?o v? (h? mi?n d?ch) y?u.  Co? v?t th??ng h?? trn da nh? v?t c??t, v?t bo?ng, v?t c??n ho??c v?t x???c. Vi khu?n co? th? xm nh?p c? th? qua nh??ng v?t th??ng h?? na?y.  Cao tu?i.  B? ti?u ???ng.  Bi? m?t lo?i b?nh gan ko da?i (ma?n ti?nh) (x? gan) ho??c b?nh th?n.  S?? du?ng thu?c qua ????ng t?nh m?ch (IV). TRI?U CH?NG Nh?ng tri?u ch?ng c?a tnh tr?ng ny bao g?m:  V?t ?o?, v?t so?c, ho??c ??m trn da.  Vng s?ng trn da.  Nha?y ca?m ?au ho??c ?au khi s?? va?o m?t vu?ng da.  Da ?m.  S?t.  ?n l?nh.  M?n n??c. CH?N ?ON Tnh tr?ng ny c th? ???c ch?n ?on d?a vo khai thc b?nh s? v khm th?c th?Sander Nephew v? c?ng c th? ???c lm cc xt nghi?m, bao g?m:  Xt nghi?m mu.  Xe?t nghi?m trong pho?ng thi? nghi?m.  Ki?m tra b?ng hnh ?nh. ?I?U TR? ?i?u tr? tnh tr?ng b?nh l ny c th? bao g?m:  Du?ng thu?c, ch??ng ha?n nh? thu?c kha?ng sinh ho??c kha?ng histamine.  Ch?m so?c h? tr??, ch??ng ha?n nh? nghi? ng?i va? ch???m mi?ng va?i la?nh ho??c ?m (b?ng e?p la?nh ho??c ?m) ln da.  Ch?m so?c ta?i b?nh vi?n, n?u tnh tr?ng n??ng. Nhi?m tru?ng th???ng ??? trong  vo?ng 1-2 nga?y ?i?u tri?. H??NG D?N CH?M [place] T?I NH  Ch? s? d?ng thu?c khng c?n k ??n v thu?c c?n k ??n theo ch? d?n c?a chuyn gia ch?m [place] s?c kh?e.  N?u qu v? ???c k thu?c khng sinh, hy dng thu?c theo ch? d?n c?a chuyn gia ch?m [place] s?c kh?e. Khng d?ng u?ng thu?c khng sinh ngay c? khi qu v? b?t ??u c?m th?y ?? h?n.  U?ng ?? n??c ?? gi? cho n??c ti?u trong ho?c c mu vng nh?t.  Khng s?? va?o ho?c cha? xa?t va?o vng bi? nhi?m trng.  Nng (nng cao) vng b? nhi?m trng ln cao h?n tim khi qu v? ng?i ho?c n?m.  ??t b?ng e?p ?m ho?c la?nh vo vng b? ?nh h??ng theo ch? d?n c?a chuyn gia ch?m [place] s?c  kh?e.  Tun th? t?t c? cc cu?c h?n khm l?i theo ch? d?n c?a chuyn gia ch?m [place] s?c kh?e. ?i?u ny c vai tr quan tr?ng. Nh??ng l?n kha?m na?y giu?p chuyn gia ch?m so?c s??c kho?e ch??c ch??n l khng pha?t sinh m?t b?nh nhi?m tru?ng nghim tro?ng h?n.  ?I KHM N?U:  Qu v? b? s?t.  Cc tri?u ch?ng c?a qu v? khng c?i thi?n sau 1-2 ngy k? t?? lu?c b??t ??u ?i?u tr?Marland Kitchen  X??ng ho?c kh?p bn d??i vng b? nhi?m trng tr? nn ?au ??n sau [place] ? lnh.  Nhi?m trng ti pht ? cng m?t vng ho?c ? m?t vng khc.  Qu v? nh?n th?y m?t c?c s?ng ln trong vng b? nhi?m trng.  Qu v? c cc tri?u ch?ng m?i.  Qu v? c c?m gic m?t m?i ton thn (tnh tr?ng m?t l?) km theo ?au v nh?c c? b?p.  NGAY L?P T?C ?I KHM N?U:  Tri?u ch?ng c?a qu v? tr?m tr?ng h?n.  Qu v? c?m th?y r?t bu?n ng?.  Qu v? b? nn m?a ho?c tiu ch?y dai d??ng.  Qu v? th?y c nh?ng v?t s?c ?? ? vng b? nhi?m trng.  Vng mu ?? tr? nn l?n h?n ho?c chuy?n sang mu s?m.  Thng tin ny khng nh?m m?c ?ch thay th? cho l?i khuyn m chuyn gia ch?m [place] s?c kh?e ni v?i qu v?. Hy b?o ??m qu v? ph?i th?o lu?n b?t k? v?n ?? g m qu v? c v?i chuyn gia ch?m [place] s?c kh?e c?a qu v?. Document Released: 01/19/2005 Document Revised: 08/03/2015 Document Reviewed: 02/18/2015 Elsevier Interactive Patient Education  2017 [employer].

## 2016-12-15 NOTE — Assessment & Plan Note (Signed)
Much improved; will continue medications. Return as needed.

## 2016-12-15 NOTE — Progress Notes (Signed)
Maria [name] 46 y.o.   Chief Complaint  Patient presents with  . Follow-up    left eye    HISTORY OF PRESENT ILLNESS: This is a 46 y.o. female here for follow up of left eye eyelid cellulitis with pustule; doing better; no complaints. HPI   Prior to Admission medications   Medication Sig Start Date End Date Taking? Authorizing Provider  clindamycin (CLEOCIN) 300 MG capsule Take 1 capsule (300 mg total) by mouth 3 (three) times daily. 12/09/16 12/19/16  Rutherford Guys, MD  erythromycin ophthalmic ointment Place 1 application into the left eye 2 times daily at 12 noon and 4 pm. 12/12/16 12/19/16  Horald Pollen, MD    No Known Allergies  Patient Active Problem List   Diagnosis Date Noted  . Preseptal cellulitis of left lower eyelid 12/12/2016  . Uterine leiomyoma 08/03/2016  . Nocturia 05/23/2016  . Urinary frequency 05/16/2016  . Right thyroid nodule 03/28/2016    No past medical history on file.  Past Surgical History:  Procedure Laterality Date  . RHINOPLASTY      Social History   Social History  . Marital status: Divorced    Spouse name: N/A  . Number of children: N/A  . Years of education: N/A   Occupational History  . Not on file.   Social History Main Topics  . Smoking status: Never Smoker  . Smokeless tobacco: Never Used  . Alcohol use Yes     Comment: occasionally  . Drug use: No  . Sexual activity: Yes    Birth control/ protection: None   Other Topics Concern  . Not on file   Social History Narrative   Marital status: separated since 2014.  Not dating.      Children:  2 children (18, 3)      Lives: with 2 children.      Employment:  Insurance claims handler; full time      Tobacco: none      Alcohol: none      Drugs: none      Exercise: none    Family History  Problem Relation Age of Onset  . Hypertension Father      Review of Systems  Constitutional: Negative.  Negative for chills and fever.  Eyes: Negative for blurred vision and double  vision.  Gastrointestinal: Negative for abdominal pain, diarrhea, nausea and vomiting.  Skin: Negative.  Negative for rash.  Neurological: Negative for dizziness and headaches.  All other systems reviewed and are negative.  Vitals:   12/15/16 0815  BP: 113/70  Pulse: 60  Resp: 16  Temp: 98.1 F (36.7 C)  SpO2: 99%     Physical Exam  Constitutional: She is oriented to person, place, and time. She appears well-developed and well-nourished.  HENT:  Head: Normocephalic and atraumatic.  Eyes: Pupils are equal, round, and reactive to light. Conjunctivae and EOM are normal.  Left lower eyelid: much improved pustule with less erythema and swelling.  Cardiovascular: Normal rate and regular rhythm.   Pulmonary/Chest: Effort normal.  Musculoskeletal: Normal range of motion.  Neurological: She is alert and oriented to person, place, and time.  Skin: Skin is warm and dry. Capillary refill takes less than 2 seconds. No rash noted.  Psychiatric: She has a normal mood and affect. Her behavior is normal.  Vitals reviewed.   Preseptal cellulitis of left lower eyelid Much improved; will continue medications. Return as needed.    ASSESSMENT & PLAN: Maria [name] was seen today for follow-up.  Diagnoses and all orders for this visit:  Preseptal cellulitis of left lower eyelid Comments: much improved    Patient Instructions   We recommend that you schedule a mammogram for breast cancer screening. Typically, you do not need a referral to do this. Please contact a local imaging center to schedule your mammogram.  [facility] - [phone]  *ask for the Radiology Department [facility] [place] ([place]) - [phone] or [phone]  [facility] - [phone] [place] [phone] [facility] - [phone]  *ask for the [facility] - [phone]  *ask for the Radiology  Department [facility] - [phone]  *ask for the [place] - [phone]    IF you received an x-ray today, you will receive an invoice from [facility]. Please contact [facility] at [phone] with questions or concerns regarding your invoice.   IF you received labwork today, you will receive an invoice from [place]. Please contact [facility] at [phone] with questions or concerns regarding your invoice.   Our billing staff will not be able to assist you with questions regarding bills from these companies.  You will be contacted with the lab results as soon as they are available. The fastest way to get your results is to activate your My Chart account. Instructions are located on the last page of this paperwork. If you have not heard from [place] regarding the results in 2 weeks, please contact this office.     Vim m t? ba?o, Ng???i l??n (Cellulitis, Adult) Vim m t? ba?o la? m?t b?nh nhi?m tru?ng da. Vng nhi?m trng th??ng ?? v nh?y c?m ?au. Tnh tr?ng na?y xu?t hi?n th??ng xuyn nh?t ? cnh tay v c?ng chn. Nhi?m tru?ng co? th? lan ??n ca?c c?, ma?u va? m bn d???i va? tr?? nn tr?m tro?ng. M?t ?i?u r?t quan tro?ng la? pha?i ?i?u tri? tnh tr?ng na?y. NGUYN NHN Vim m t? ba?o do vi khu?n gy ra. Vi khu?n xm nh?p qua ch? ??t trn da, ch??ng ha?n nh? v?t c??t, v?t bo?ng, v?t cn tru?ng ??t, v?t loe?t h?? ho??c v?t n??t. CC Y?U T? NGUY C? Tnh tr?ng ny hay x?y ra h?n ? nh?ng ng??i:  C h? th?ng b?o v? (h? mi?n d?ch) y?u.  Co? v?t th??ng h?? trn da nh? v?t c??t, v?t bo?ng, v?t c??n ho??c v?t x???c. Vi khu?n co? th? xm nh?p c? th? qua nh??ng v?t th??ng h?? na?y.  Cao tu?i.  B? ti?u ???ng.  Bi? m?t lo?i b?nh gan ko da?i (ma?n ti?nh) (x? gan) ho??c b?nh th?n.  S?? du?ng thu?c qua ????ng t?nh m?ch (IV). TRI?U CH?NG Nh?ng tri?u ch?ng c?a tnh tr?ng ny bao g?m:  V?t ?o?,  v?t so?c, ho??c ??m trn da.  Vng s?ng trn da.  Nha?y ca?m ?au ho??c ?au khi s?? va?o m?t vu?ng da.  Da ?m.  S?t.  ?n l?nh.  M?n n??c. CH?N ?ON Tnh tr?ng ny c th? ???c ch?n ?on d?a vo khai thc b?nh s? v khm th?c th?Sander Nephew v? c?ng c th? ???c lm cc xt nghi?m, bao g?m:  Xt nghi?m mu.  Xe?t nghi?m trong pho?ng thi? nghi?m.  Ki?m tra b?ng hnh ?nh. ?I?U TR? ?i?u tr? tnh tr?ng b?nh l ny c th? bao g?m:  Du?ng thu?c, ch??ng ha?n nh? thu?c kha?ng sinh ho??c kha?ng histamine.  Ch?m so?c h? tr??, ch??ng ha?n nh? nghi? ng?i va? ch???m mi?ng va?i  la?nh ho??c ?m (b?ng e?p la?nh ho??c ?m) ln da.  Ch?m so?c ta?i b?nh vi?n, n?u tnh tr?ng n??ng. Nhi?m tru?ng th???ng ??? trong vo?ng 1-2 nga?y ?i?u tri?. H??NG D?N CH?M [place] T?I NH  Ch? s? d?ng thu?c khng c?n k ??n v thu?c c?n k ??n theo ch? d?n c?a chuyn gia ch?m [place] s?c kh?e.  N?u qu v? ???c k thu?c khng sinh, hy dng thu?c theo ch? d?n c?a chuyn gia ch?m [place] s?c kh?e. Khng d?ng u?ng thu?c khng sinh ngay c? khi qu v? b?t ??u c?m th?y ?? h?n.  U?ng ?? n??c ?? gi? cho n??c ti?u trong ho?c c mu vng nh?t.  Khng s?? va?o ho?c cha? xa?t va?o vng bi? nhi?m trng.  Nng (nng cao) vng b? nhi?m trng ln cao h?n tim khi qu v? ng?i ho?c n?m.  ??t b?ng e?p ?m ho?c la?nh vo vng b? ?nh h??ng theo ch? d?n c?a chuyn gia ch?m [place] s?c kh?e.  Tun th? t?t c? cc cu?c h?n khm l?i theo ch? d?n c?a chuyn gia ch?m [place] s?c kh?e. ?i?u ny c vai tr quan tr?ng. Nh??ng l?n kha?m na?y giu?p chuyn gia ch?m so?c s??c kho?e ch??c ch??n l khng pha?t sinh m?t b?nh nhi?m tru?ng nghim tro?ng h?n.  ?I KHM N?U:  Qu v? b? s?t.  Cc tri?u ch?ng c?a qu v? khng c?i thi?n sau 1-2 ngy k? t?? lu?c b??t ??u ?i?u tr?Marland Kitchen  X??ng ho?c kh?p bn d??i vng b? nhi?m trng tr? nn ?au ??n sau [place] ? lnh.  Nhi?m trng ti pht ? cng m?t vng ho?c ? m?t vng khc.  Qu v? nh?n th?y m?t c?c s?ng ln trong  vng b? nhi?m trng.  Qu v? c cc tri?u ch?ng m?i.  Qu v? c c?m gic m?t m?i ton thn (tnh tr?ng m?t l?) km theo ?au v nh?c c? b?p.  NGAY L?P T?C ?I KHM N?U:  Tri?u ch?ng c?a qu v? tr?m tr?ng h?n.  Qu v? c?m th?y r?t bu?n ng?.  Qu v? b? nn m?a ho?c tiu ch?y dai d??ng.  Qu v? th?y c nh?ng v?t s?c ?? ? vng b? nhi?m trng.  Vng mu ?? tr? nn l?n h?n ho?c chuy?n sang mu s?m.  Thng tin ny khng nh?m m?c ?ch thay th? cho l?i khuyn m chuyn gia ch?m [place] s?c kh?e ni v?i qu v?. Hy b?o ??m qu v? ph?i th?o lu?n b?t k? v?n ?? g m qu v? c v?i chuyn gia ch?m [place] s?c kh?e c?a qu v?. Document Released: 01/19/2005 Document Revised: 08/03/2015 Document Reviewed: 02/18/2015 Elsevier Interactive Patient Education  2017 Elsevier Inc.      Agustina Caroli, MD [facility]

## 2017-01-26 ENCOUNTER — Ambulatory Visit (INDEPENDENT_AMBULATORY_CARE_PROVIDER_SITE_OTHER): Payer: BLUE CROSS/BLUE SHIELD | Admitting: Family Medicine

## 2017-01-26 ENCOUNTER — Encounter: Payer: Self-pay | Admitting: Family Medicine

## 2017-01-26 DIAGNOSIS — Z0000 Encounter for general adult medical examination without abnormal findings: Secondary | ICD-10-CM

## 2017-01-26 DIAGNOSIS — G44219 Episodic tension-type headache, not intractable: Secondary | ICD-10-CM

## 2017-01-26 LAB — POCT URINALYSIS DIP (MANUAL ENTRY)
Bilirubin, UA: NEGATIVE
Blood, UA: NEGATIVE
Glucose, UA: NEGATIVE mg/dL
Ketones, POC UA: NEGATIVE mg/dL
Leukocytes, UA: NEGATIVE
Nitrite, UA: NEGATIVE
Protein Ur, POC: NEGATIVE mg/dL
Spec Grav, UA: 1.02 (ref 1.010–1.025)
Urobilinogen, UA: 0.2 E.U./dL
pH, UA: 5.5 (ref 5.0–8.0)

## 2017-01-26 MED ORDER — NAPROXEN 500 MG PO TABS: 500.0000 mg | ORAL_TABLET | Freq: Two times a day (BID) | ORAL | 0 refills | Status: DC

## 2017-01-26 NOTE — Patient Instructions (Addendum)
IF you received an x-ray today, you will receive an invoice from [facility]. Please contact [facility] Rad[facility] at [phone] with questions or concerns regarding your invoice.   IF you received labwork today, you will receive an invoice from [place]. Please contact [facility] at [phone] with questions or concerns regarding your invoice.   Our billing staff will not be able to assist you with questions regarding bills from these companies.  You will be contacted with the lab results as soon as they are available. The fastest way to get your results is to activate your My Chart account. Instructions are located on the last page of this paperwork. If you have not heard from [place] regarding the results in 2 weeks, please contact this office.     ?au ??u thng th???ng khng ro? nguyn nhn (General Headache Without Cause) ?au ??u l ?au hay kh ch?u ?? xung Holli Humbles ??u ho?c khu v??c c?. Nguyn nhn c? th? c?a ?au ??u c th? khng ???c pha?t hi?n. C nhi?u nguyn nhn v lo?i ?au ??u. M?t vi loa?i ?au ??u ph? bi?n la?:  ?au ??u c?ng th?ng.  ?au n?a ??u.  ?au ??u t??ng c?n.  ?au ??u hng ngy m?n tnh. H??NG D?N CH?M [place] T?I NH Theo di tnh tr?ng c?a qu v? ?? pht hi?n b?t k? thay ??i no. Ti?n hnh nh?ng b???c sau ?? gip ca?i thi?n ti?nh tra?ng cu?a quy? vi?: Qu?n l c?n ?au  Ch? s? d?ng thu?c khng c?n k ??n v thu?c c?n k ??n theo ch? d?n c?a chuyn gia ch?m [place] s?c kh?e.  N?m trong m?t phng t?i, yn t?nh khi qu v? b? ?au ??u.  N?u ???c ch? d?n, ch??m ? l?nh vo vng ??u v c?: ? Cho ? l?nh vo ti nh?a. ? ?? kh?n t?m ? [place] v ti ch??m. ? Ch??m ? l?nh trong 20 pht, 2-3 l?n m?i ngy.  S? d?ng ??m no?ng ho?c t?m n??c nng ?? t?ng nhi?t vo vng ??u v c? theo ch? d?n c?a chuyn gia ch?m [place] s?c kh?e.  Gi? cho nh sng d?u nh? n?u nh sng m?nh lm qu v? kh ch?u v lm ch?ng ?au ??u t?i t? h?n. ?n v u?ng  ?n u?ng theo l?ch bnh  th??ng.  H?n ch? u?ng r??u.  Gi?m l??ng cafein quy? vi? u?ng, ho?c ng?ng u?ng caffeine. H??ng d?n chung  Tun th? t?t c? cc cu?c h?n khm l?i theo  ki?n c?a chuyn gia ch?m [place] s?c kh?e. ?i?u ny c vai tr quan tr?ng.  Ghi nh?t k ?au ??u hng ngy ?? gip tm ra ?i?u g c th? gy cc c?n ?au ??u. V d?, hy ghi l?i: ? Qu v? ?n v u?ng g. ? Qu v? ? ng? bao lu. ? B?t k? thay ??i no trong ch? ?? ?n ho?c thu?c men.  Th? k? thu?t xoa bp ho?c cc k? thu?t th? gin khc.  H?n ch? c?ng th?ng.  Ng?i th?ng d?y, v khng la?m c?ng th?ng c? cu?a quy? vi?.  Khng s? d?ng cc s?n ph?m thu?c l no, bao g?m thu?c l d?ng ht, thu?c l d?ng nhai ho?c thu?c l ?i?n t?. N?u qu v? c?n gip ?? ?? cai thu?c, hy h?i chuyn gia ch?m [place] s?c kh?e.  T?p th? d?c th??ng xuyn theo  ki?n c?a chuyn gia ch?m [place] s?c kh?e.  Ng? theo m?t l?ch trnh bi?nh th??ng. Ng? 7-9 ti?ng, ho?c th?i gian ng? theo khuy?n ngh? c?a [place]  gia ch?m [place] s?c kh?e. ?I [facility] N?U:  Tri?u ch?ng c?a qu v? khng c?i thi?n ???c b?ng thu?c.  Quy? vi? b? ?au ??u khc v??i ?au ??u thng th??ng.  Qu v? b? bu?n nn ho?c qu v? nn.  Qu v? b? s?t.  NGAY L?P T?C ?I [facility] N?U:  Qu v? ?au ??u n?ng h?n.  Qu v? lin t?c b? nn m?a.  Qu v? b? c?ng c?.  Qu v? khng nhn th?y g.  Qu v? b? kh ni.  Qu v? b? ?au ? m?t ho?c tai.  Qu v? b? y?u c? ho?c m?t ki?m sot c?.  Qu v? m?t th?ng b?ng ho?c ?i l?i kh kh?n.  Qu v? c?m th?y mu?n ng?t ho?c ng?t.  Qu v? b? l l?n.  Thng tin ny khng nh?m m?c ?ch thay th? cho l?i khuyn m chuyn gia ch?m [place] s?c kh?e ni v?i qu v?. Hy b?o ??m qu v? ph?i th?o lu?n b?t k? v?n ?? g m qu v? c v?i chuyn gia ch?m [place] s?c kh?e c?a qu v?. Document Released: 08/03/2015 Document Revised: 08/03/2015 Document Reviewed: 08/04/2014 Else[employer] Interactive Patient Education  2017 [employer].

## 2017-01-26 NOTE — Progress Notes (Signed)
10/4/201810:49 AM  Maria [name] 1970/08/29, 46 y.o. female [id number]  Chief Complaint  Patient presents with  . Annual Exam  . Headache    HPI:   Patient is a 46 y.o. female who presents today for her CPE. Gyn care done elsewhere. Pap done in April 2018. Otherwise, would like to discuss new onset headaches for past several weeks, right sided, dull, temple that radiates to occiput. Not associated with vision or hearing changes, nausea, vomiting, does not wake her up at night, numbness, tingling or weakness. No trauma. Has had recent stressors at home. She has also had recent eye exam with new glasses for work.   Depression screen [facility] 2/9 01/26/2017 12/15/2016 12/12/2016  Decreased Interest 0 0 0  Down, Depressed, Hopeless 0 0 0  PHQ - 2 Score 0 0 0  Altered sleeping - - -  Tired, decreased energy - - -  Change in appetite - - -  Feeling bad or failure about yourself  - - -  Trouble concentrating - - -  Moving slowly or fidgety/restless - - -  Suicidal thoughts - - -  PHQ-9 Score - - -  Difficult doing work/chores - - -    No Known Allergies  Prior to Admission medications   Medication Sig Start Date End Date Taking? Authorizing Provider  ibuprofen (ADVIL,MOTRIN) 200 MG tablet Take 200 mg by mouth every 6 (six) hours as needed.   Yes [provider]    No past medical history on file.  Past Surgical History:  Procedure Laterality Date  . RHINOPLASTY      Social History  Substance Use Topics  . Smoking status: Never Smoker  . Smokeless tobacco: Never Used  . Alcohol use Yes     Comment: occasionally    Family History  Problem Relation Age of Onset  . Hypertension Father     Review of Systems  Constitutional: Negative for chills and fever.  HENT: Negative for congestion, ear pain, hearing loss and sore throat.   Eyes: Negative for blurred vision, double vision and photophobia.  Respiratory: Negative for cough and shortness of breath.   Cardiovascular:  Negative for chest pain, palpitations and leg swelling.  Gastrointestinal: Negative for abdominal pain, nausea and vomiting.  Genitourinary: Negative for dysuria and hematuria.  Musculoskeletal: Negative for joint pain and myalgias.  Neurological: Positive for headaches. Negative for dizziness, sensory change, speech change and focal weakness.  Psychiatric/Behavioral: Negative for depression. The patient is not nervous/anxious.      OBJECTIVE:  There were no vitals taken for this visit.  Physical Exam  Constitutional: She is oriented to person, place, and time and well-developed, well-nourished, and in no distress.  HENT:  Head: Normocephalic and atraumatic.  Right Ear: Hearing, tympanic membrane, external ear and ear canal normal.  Left Ear: Hearing, tympanic membrane, external ear and ear canal normal.  Mouth/Throat: Oropharynx is clear and moist.  Eyes: Pupils are equal, round, and reactive to light. EOM are normal.  Neck: Neck supple. No thyromegaly present.  Cardiovascular: Normal rate, regular rhythm, normal heart sounds and intact distal pulses.  Exam reveals no gallop and no friction rub.   No murmur heard. Pulmonary/Chest: Effort normal and breath sounds normal. She has no wheezes. She has no rales.  Abdominal: Soft. Bowel sounds are normal. She exhibits no distension and no mass. There is no tenderness.  Musculoskeletal: Normal range of motion. She exhibits no edema.  Lymphadenopathy:    She has no cervical  adenopathy.  Neurological: She is alert and oriented to person, place, and time. She has normal reflexes. No cranial nerve deficit. Gait normal. Coordination normal.  Skin: Skin is warm and dry.  Psychiatric: Mood and affect normal.    ASSESSMENT and PLAN  1. Annual physical exam No concerns per history or exam. Routine HCM labs ordered. HCM reviewed/discussed. Anticipatory guidance regarding healthy weight, lifestyle and choices given.   - POCT urinalysis  dipstick - CBC with Differential - Comprehensive metabolic panel - Lipid panel - TSH  Declined flu vaccine today  2. Episodic tension-type headache, not intractable No red flags. Discussed supportive measures, triggers. Trial of naproxen. RTC precautions given.   - DC advil. - naproxen (NAPROSYN) 500 MG tablet; Take 1 tablet (500 mg total) by mouth 2 (two) times daily with a meal.  Return in about 1 year (around 01/26/2018).    Rutherford Guys, MD Primary Care at [facility] [place], [place] [address] Ph.  [phone] Fax [phone]

## 2017-01-27 ENCOUNTER — Encounter: Payer: Self-pay | Admitting: Family Medicine

## 2017-01-27 LAB — COMPREHENSIVE METABOLIC PANEL
ALT: 15 IU/L (ref 0–32)
AST: 20 IU/L (ref 0–40)
Albumin/Globulin Ratio: 1.4 (ref 1.2–2.2)
Albumin: 4.4 g/dL (ref 3.5–5.5)
Alkaline Phosphatase: 55 IU/L (ref 39–117)
BUN/Creatinine Ratio: 26 — ABNORMAL HIGH (ref 9–23)
BUN: 18 mg/dL (ref 6–24)
Bilirubin Total: 0.4 mg/dL (ref 0.0–1.2)
CO2: 24 mmol/L (ref 20–29)
Calcium: 9.2 mg/dL (ref 8.7–10.2)
Chloride: 102 mmol/L (ref 96–106)
Creatinine, Ser: 0.68 mg/dL (ref 0.57–1.00)
GFR calc Af Amer: 122 mL/min/{1.73_m2} (ref >59)
GFR calc non Af Amer: 106 mL/min/{1.73_m2} (ref >59)
Globulin, Total: 3.2 g/dL (ref 1.5–4.5)
Glucose: 88 mg/dL (ref 65–99)
Potassium: 4.3 mmol/L (ref 3.5–5.2)
Sodium: 139 mmol/L (ref 134–144)
Total Protein: 7.6 g/dL (ref 6.0–8.5)

## 2017-01-27 LAB — CBC WITH DIFFERENTIAL/PLATELET
Basophils Absolute: 0 10*3/uL (ref 0.0–0.2)
Basos: 0 %
EOS (ABSOLUTE): 0 10*3/uL (ref 0.0–0.4)
Eos: 0 %
Hematocrit: 36.6 % (ref 34.0–46.6)
Hemoglobin: 12.3 g/dL (ref 11.1–15.9)
Immature Grans (Abs): 0 10*3/uL (ref 0.0–0.1)
Immature Granulocytes: 0 %
Lymphocytes Absolute: 1.8 10*3/uL (ref 0.7–3.1)
Lymphs: 24 %
MCH: 31.7 pg (ref 26.6–33.0)
MCHC: 33.6 g/dL (ref 31.5–35.7)
MCV: 94 fL (ref 79–97)
Monocytes Absolute: 0.3 10*3/uL (ref 0.1–0.9)
Monocytes: 4 %
Neutrophils Absolute: 5.2 10*3/uL (ref 1.4–7.0)
Neutrophils: 72 %
Platelets: 230 10*3/uL (ref 150–379)
RBC: 3.88 x10E6/uL (ref 3.77–5.28)
RDW: 13.8 % (ref 12.3–15.4)
WBC: 7.3 10*3/uL (ref 3.4–10.8)

## 2017-01-27 LAB — LIPID PANEL
Chol/HDL Ratio: 2.5 ratio (ref 0.0–4.4)
Cholesterol, Total: 212 mg/dL — ABNORMAL HIGH (ref 100–199)
HDL: 84 mg/dL (ref >39)
LDL Calculated: 117 mg/dL — ABNORMAL HIGH (ref 0–99)
Triglycerides: 56 mg/dL (ref 0–149)
VLDL Cholesterol Cal: 11 mg/dL (ref 5–40)

## 2017-01-27 LAB — TSH: TSH: 1.25 u[IU]/mL (ref 0.450–4.500)

## 2017-02-28 ENCOUNTER — Encounter: Payer: Self-pay | Admitting: Family Medicine

## 2017-02-28 ENCOUNTER — Ambulatory Visit: Payer: BLUE CROSS/BLUE SHIELD | Admitting: Family Medicine

## 2017-02-28 VITALS — BP 126/80 | HR 85 | Temp 99.3°F | Resp 16 | Ht 62.0 in | Wt 115.8 lb

## 2017-02-28 DIAGNOSIS — J302 Other seasonal allergic rhinitis: Secondary | ICD-10-CM | POA: Diagnosis not present

## 2017-02-28 DIAGNOSIS — R51 Headache: Secondary | ICD-10-CM

## 2017-02-28 DIAGNOSIS — R519 Headache, unspecified: Secondary | ICD-10-CM

## 2017-02-28 MED ORDER — TRIAMCINOLONE ACETONIDE 55 MCG/ACT NA AERO: 2.0000 | INHALATION_SPRAY | Freq: Every day | NASAL | 12 refills | Status: DC

## 2017-02-28 NOTE — Patient Instructions (Addendum)
IF you received an x-ray today, you will receive an invoice from [facility]. Please contact [facility] at [phone] with questions or concerns regarding your invoice.   IF you received labwork today, you will receive an invoice from [place]. Please contact [facility] at [phone] with questions or concerns regarding your invoice.   Our billing staff will not be able to assist you with questions regarding bills from these companies.  You will be contacted with the lab results as soon as they are available. The fastest way to get your results is to activate your My Chart account. Instructions are located on the last page of this paperwork. If you have not heard from [place] regarding the results in 2 weeks, please contact this office.    We recommend that you schedule a mammogram for breast cancer screening. Typically, you do not need a referral to do this. Please contact a local imaging center to schedule your mammogram.  [facility] - [phone]  *ask for the Radiology [place] ([place]) - [phone] or [phone]  [facility] - [phone] [place] [phone] [facility] - [phone]  *ask for the [facility] - [phone]  *ask for the Radiology Department [facility] - [phone]  *ask for the Mammography Department [facility] - [phone] m?i d? ?ng (Allergic Rhinitis) Vim m?i d? ?ng l khi nim m?c m?i ph?n ?ng v?i cc d? ?ng nguyn. Cc d? ?ng nguyn l cc h?t nh? trong khng kh khi?n cho c? th? qu v? c ph?n ?ng d? ?ng. ?i?u ny khi?n cho c? th? qu v? gi?i phng ra cc khng th? d? ?ng. Jasmine December m?t chu?i cc s? ki?n, cu?i cng th chng khi?n cho c? th? qu v? gi?i phng histamine vo mu. M?c d vi?c gi?i phng histamine nh?m m?c ?ch b?o v? c? th?, nh?ng chnh ?i?u ny gy ra c?m gic kh ch?u  cho qu v?, ch?ng h?n nh? h?t h?i th??ng xuyn, ngh?t m?i, s? m?i v ng?a m?i. NGUYN NHN Vim m?i d? ?ng theo ma (c?m m?o) do cc d? ?ng nguyn ph?n hoa, c th? c ngu?n g?c t? c?, cy c?i v c? d?i. Vim m?i d? ?ng quanh n?m (vim m?i d? ?ng kinh nin) do cc d? ?ng nguyn nh? b?i m?t trong nh, lng v?t nui v bo t? n?m m?c. TRI?U CH?NG  Ngh?t m?i (t?c ngh?n).  Ng?a m?i, s? m?i km v?i h?t h?i v ch?y n??c m?t.  CH?N ?ON Chuyn gia ch?m [place] s?c kh?e c?a qu v? c th? gip qu v? xc ??nh d? ?ng nguyn ho?c cc d? ?ng nguyn khi?n qu v? pht sinh cc tri?u ch?ng. N?u qu v? v chuyn gia ch?m [place] s?c kh?e c?a mnh khng th? xc ??nh ???c d? ?ng nguyn ?, th c th? lm xt nghi?m da ho?c mu. Chuyn gia ch?m [place] s?c kh?e c?a qu v? s? ch?n ?on tnh tr?ng c?a qu v? sau khi khm th?c th? v h?i ti?n s? b?nh c?a qu v?. Chuyn gia ch?m [place] s?c kh?e c?a qu v? c th? ?nh gi cc tnh tr?ng khc c lin quan c?a qu v?, ch?ng h?n nh? ch?ng hen suy?n, ?au m?t ??, ho?c nhi?m trng tai. ?I?U TR? Khng th? ch?a kh?i ???c b?nh vim m?i d? ?ng, nh?ng c th? ki?m sot b?nh ny b?ng:  Cc thu?c ch?n tri?u ch?ng d? ?  ng. Nh?ng thu?c ny bao g?m chch ng?a d? ?ng, thu?c x?t m?i v cc thu?c khng histamine ???ng u?ng.  Trnh cc d? ?ng nguyn. C?m m?o th??ng ???c ?i?u tr? b?ng cc thu?c khng histamine d?ng vin ho?c d?ng x?t m?i. Thu?c khng histamine gip ch?n tc ??ng c?a histamine. C nh?ng thu?c khng c?n k ??n c th? c tc d?ng khi b? ngh?t m?i v s?ng n? quanh m?t. Hy h?i chuyn gia ch?m [place] s?c kh?e c?a qu v? tr??c khi dng ho?c cho dng lo?i thu?c ny. N?u vi?c trnh ???c d? ?ng nguyn ho?c cc thu?c ? k toa khng c tc d?ng, th chuyn gia ch?m [place] s?c kh?e c?a qu v? c th? k toa nhi?u lo?i thu?c m?i. C th? dng thu?c m?nh h?n n?u cc bi?n php ban ??u khng c tc d?ng. C th? tim thu?c gi?i m?n c?m n?u vi?c dng thu?c v bi?n php phng trnh khng c tc d?ng. Gi?i m?n c?m l  khi b?nh nhn ???c tim thu?c th??ng xuyn cho ??n khi c? th? tr? nn t nh?y c?m v?i d? ?ng nguyn ?Marland Kitchen Hy b?o ??m vi?c qu v? ??n g?p chuyn gia ch?m [place] s?c kh?e ?? ti khm n?u v?n ?? v?n ti?p di?n. H??NG D?N CH?M [place] T?I NH Khng th? trnh hon ton cc d? ?ng nguyn, nh?ng qu v? c th? lm gi?m cc tri?u ch?ng b?ng cch th?c hi?n cc b??c ?? h?n ch? ti?p xc v?i cc d? ?ng nguyn ?Marland Kitchen S? h?u ch khi bi?t chnh xc qu v? d? ?ng v?i th? g, ?? qu v? c th? trnh cc tc nhn gy d? ?ng ??c hi?u c?a mnh. ?I KHM N?U:  Qu v? b? s?t.  Qu v? b? ho khng kh?i (dai d?ng).  Qu v? b? kh th?.  Qu v? b?t ??u th? kh kh.  Cc tri?u ch?ng gy tr? ng?i cho cc ho?t ??ng th??ng ngy.  Thng tin ny khng nh?m m?c ?ch thay th? cho l?i khuyn m chuyn gia ch?m [place] s?c kh?e ni v?i qu v?. Hy b?o ??m qu v? ph?i th?o lu?n b?t k? v?n ?? g m qu v? c v?i chuyn gia ch?m [place] s?c kh?e c?a qu v?. Document Released: 08/03/2015 Document Revised: 08/03/2015 Document Reviewed: 12/17/2012 Elsevier Interactive Patient Education  2017 [employer].

## 2017-02-28 NOTE — Progress Notes (Signed)
11/6/20182:05 PM  Maria [name] [birth year], 46 y.o. female [id number]  Chief Complaint  Patient presents with  . Headache    HPI:   Patient is a 46 y.o. female no significant past medical history who presents today for follow-up on headaches and with some recent allergy symptoms.  Visit conducted with assistance of interpreter.  Patient states that right sided headache dull temple that radiates to occipital started 2 months ago, has never had previously, intermittent, not better with NSAIDs, feels that thinking makes them worse. She denies any stressors at home or work. Not associated with vision or hearing changes, nausea, vomiting, does not wake her up from sleep. Denies neck pain.   She is also c/o 2 weeks of sneezing, nasal congestion, runny nose,dry throat. Denies any fever or chills, cough, sob, sinus pain.  Not taking any medications. Has a history of rhinoplasty.  Depression screen [facility] 2/9 01/26/2017 12/15/2016 12/12/2016  Decreased Interest 0 0 0  Down, Depressed, Hopeless 0 0 0  PHQ - 2 Score 0 0 0  Altered sleeping - - -  Tired, decreased energy - - -  Change in appetite - - -  Feeling bad or failure about yourself  - - -  Trouble concentrating - - -  Moving slowly or fidgety/restless - - -  Suicidal thoughts - - -  PHQ-9 Score - - -  Difficult doing work/chores - - -    No Known Allergies  Prior to Admission medications   Medication Sig Start Date End Date Taking? Authorizing Provider  naproxen (NAPROSYN) 500 MG tablet Take 1 tablet (500 mg total) by mouth 2 (two) times daily with a meal. 01/26/17  Yes Rutherford Guys, MD    History reviewed. No pertinent past medical history.  Past Surgical History:  Procedure Laterality Date  . RHINOPLASTY      Social History   Tobacco Use  . Smoking status: Never Smoker  . Smokeless tobacco: Never Used  Substance Use Topics  . Alcohol use: Yes    Comment: occasionally    Family History  Problem Relation Age of  Onset  . Hypertension Father     Review of Systems  Constitutional: Negative for chills and fever.  HENT: Positive for congestion. Negative for ear pain, hearing loss, sinus pain, sore throat and tinnitus.   Eyes: Negative for blurred vision, double vision and photophobia.  Respiratory: Negative for cough and shortness of breath.   Cardiovascular: Negative for chest pain, palpitations and leg swelling.  Gastrointestinal: Negative for abdominal pain, nausea and vomiting.  Neurological: Positive for headaches. Negative for sensory change, speech change and focal weakness.  Endo/Heme/Allergies: Positive for environmental allergies.  Psychiatric/Behavioral: The patient is not nervous/anxious.      OBJECTIVE:  Blood pressure 126/80, pulse 85, temperature 99.3 F (37.4 C), temperature source Oral, resp. rate 16, height 5\' 2"  (1.575 m), weight 115 lb 12.8 oz (52.5 kg), last menstrual period 02/01/2017, SpO2 100 %.  Physical Exam  Constitutional: She is oriented to person, place, and time and well-developed, well-nourished, and in no distress.  HENT:  Head: Normocephalic and atraumatic.  Right Ear: Hearing, tympanic membrane, external ear and ear canal normal.  Left Ear: Hearing, tympanic membrane, external ear and ear canal normal.  Mouth/Throat: Oropharynx is clear and moist.  Eyes: EOM are normal. Pupils are equal, round, and reactive to light.  Neck: Neck supple. No thyromegaly present.  Cardiovascular: Normal rate, regular rhythm and normal heart sounds. Exam reveals no  gallop and no friction rub.  No murmur heard. Pulmonary/Chest: Effort normal and breath sounds normal. She has no wheezes. She has no rales.  Musculoskeletal: Normal range of motion. She exhibits no edema.  Lymphadenopathy:    She has no cervical adenopathy.  Neurological: She is alert and oriented to person, place, and time. She has normal strength and normal reflexes. No cranial nerve deficit. Gait normal.  Coordination normal.  Skin: Skin is warm and dry.  Psychiatric: Mood and affect normal.  Nursing note and vitals reviewed.  Recent Results (from the past 2160 hour(s))  POCT urinalysis dipstick     Status: Normal   Collection Time: 01/26/17 10:53 AM  Result Value Ref Range   Color, UA yellow yellow   Clarity, UA clear clear   Glucose, UA negative negative mg/dL   Bilirubin, UA negative negative   Ketones, POC UA negative negative mg/dL   Spec Grav, UA 1.020 1.010 - 1.025   Blood, UA negative negative   pH, UA 5.5 5.0 - 8.0   Protein Ur, POC negative negative mg/dL   Urobilinogen, UA 0.2 0.2 or 1.0 E.U./dL   Nitrite, UA Negative Negative   Leukocytes, UA Negative Negative  CBC with Differential     Status: None   Collection Time: 01/26/17 11:28 AM  Result Value Ref Range   WBC 7.3 3.4 - 10.8 x10E3/uL   RBC 3.88 3.77 - 5.28 x10E6/uL   Hemoglobin 12.3 11.1 - 15.9 g/dL   Hematocrit 36.6 34.0 - 46.6 %   MCV 94 79 - 97 fL   MCH 31.7 26.6 - 33.0 pg   MCHC 33.6 31.5 - 35.7 g/dL   RDW 13.8 12.3 - 15.4 %   Platelets 230 150 - 379 x10E3/uL   Neutrophils 72 Not Estab. %   Lymphs 24 Not Estab. %   Monocytes 4 Not Estab. %   Eos 0 Not Estab. %   Basos 0 Not Estab. %   Neutrophils Absolute 5.2 1.4 - 7.0 x10E3/uL   Lymphocytes Absolute 1.8 0.7 - 3.1 x10E3/uL   Monocytes Absolute 0.3 0.1 - 0.9 x10E3/uL   EOS (ABSOLUTE) 0.0 0.0 - 0.4 x10E3/uL   Basophils Absolute 0.0 0.0 - 0.2 x10E3/uL   Immature Granulocytes 0 Not Estab. %   Immature Grans (Abs) 0.0 0.0 - 0.1 x10E3/uL  Comprehensive metabolic panel     Status: Abnormal   Collection Time: 01/26/17 11:28 AM  Result Value Ref Range   Glucose 88 65 - 99 mg/dL   BUN 18 6 - 24 mg/dL   Creatinine, Ser 0.68 0.57 - 1.00 mg/dL   GFR calc non Af Amer 106 >59 mL/min/1.73   GFR calc Af Amer 122 >59 mL/min/1.73   BUN/Creatinine Ratio 26 (H) 9 - 23   Sodium 139 134 - 144 mmol/L   Potassium 4.3 3.5 - 5.2 mmol/L   Chloride 102 96 - 106 mmol/L     CO2 24 20 - 29 mmol/L   Calcium 9.2 8.7 - 10.2 mg/dL   Total Protein 7.6 6.0 - 8.5 g/dL   Albumin 4.4 3.5 - 5.5 g/dL   Globulin, Total 3.2 1.5 - 4.5 g/dL   Albumin/Globulin Ratio 1.4 1.2 - 2.2   Bilirubin Total 0.4 0.0 - 1.2 mg/dL   Alkaline Phosphatase 55 39 - 117 IU/L   AST 20 0 - 40 IU/L   ALT 15 0 - 32 IU/L  Lipid panel     Status: Abnormal   Collection Time: 01/26/17 11:28 AM  Result Value Ref Range   Cholesterol, Total 212 (H) 100 - 199 mg/dL   Triglycerides 56 0 - 149 mg/dL   HDL 84 >39 mg/dL   VLDL Cholesterol Cal 11 5 - 40 mg/dL   LDL Calculated 117 (H) 0 - 99 mg/dL   Chol/HDL Ratio 2.5 0.0 - 4.4 ratio    Comment:                                   T. Chol/HDL Ratio                                             Men  Women                               1/2 Avg.Risk  3.4    3.3                                   Avg.Risk  5.0    4.4                                2X Avg.Risk  9.6    7.1                                3X Avg.Risk 23.4   11.0   TSH     Status: None   Collection Time: 01/26/17 11:28 AM  Result Value Ref Range   TSH 1.250 0.450 - 4.500 uIU/mL    ASSESSMENT and PLAN  1. Headache disorder Unclear etiology, r/o concerning pathology, consider referral to neurology - MR Brain W Wo Contrast; Future - Basic metabolic panel; Future  2. Seasonal allergies Discussed supportive measures. Discussed new med r/se/b.  - triamcinolone (NASACORT) 55 MCG/ACT AERO nasal inhaler; Place 2 sprays daily into the nose.   Return in about 4 weeks (around 03/28/2017).    Rutherford Guys, MD Primary Care at [place], [place] [address] Ph.  [phone] Fax [phone]

## 2017-03-30 ENCOUNTER — Ambulatory Visit
Admission: RE | Admit: 2017-03-30 | Discharge: 2017-03-30 | Disposition: A | Discharge status: Home / Self Care | Payer: BLUE CROSS/BLUE SHIELD | Source: Ambulatory Visit | Attending: Family Medicine | Admitting: Family Medicine

## 2017-03-30 ENCOUNTER — Ambulatory Visit: Payer: BLUE CROSS/BLUE SHIELD | Admitting: Family Medicine

## 2017-03-30 DIAGNOSIS — R519 Headache, unspecified: Secondary | ICD-10-CM

## 2017-03-30 DIAGNOSIS — R51 Headache: Principal | ICD-10-CM

## 2017-03-30 MED ORDER — GADOBENATE DIMEGLUMINE 529 MG/ML IV SOLN
10.0000 mL | Freq: Once | INTRAVENOUS | Status: AC | PRN
Start: 2017-03-30 — End: 2017-03-30
  Administered 2017-03-30: 10 mL via INTRAVENOUS

## 2017-03-31 ENCOUNTER — Ambulatory Visit: Payer: BLUE CROSS/BLUE SHIELD | Admitting: Family Medicine

## 2017-03-31 ENCOUNTER — Other Ambulatory Visit: Payer: Self-pay

## 2017-03-31 ENCOUNTER — Encounter: Payer: Self-pay | Admitting: Family Medicine

## 2017-03-31 VITALS — BP 110/58 | HR 85 | Temp 98.8°F | Ht 62.21 in | Wt 113.0 lb

## 2017-03-31 DIAGNOSIS — R51 Headache: Secondary | ICD-10-CM

## 2017-03-31 DIAGNOSIS — R519 Headache, unspecified: Secondary | ICD-10-CM

## 2017-03-31 MED ORDER — AMITRIPTYLINE HCL 25 MG PO TABS: 25.0000 mg | ORAL_TABLET | Freq: Every day | ORAL | 3 refills | Status: DC

## 2017-03-31 MED ORDER — NAPROXEN 500 MG PO TABS: 500.0000 mg | ORAL_TABLET | Freq: Two times a day (BID) | ORAL | 1 refills | Status: DC

## 2017-03-31 NOTE — Patient Instructions (Signed)
     IF you received an x-ray today, you will receive an invoice from [facility]. Please contact  Radiology at [phone] with questions or concerns regarding your invoice.   IF you received labwork today, you will receive an invoice from [facility]. Please contact [facility] at [phone] with questions or concerns regarding your invoice.   Our billing staff will not be able to assist you with questions regarding bills from these companies.  You will be contacted with the lab results as soon as they are available. The fastest way to get your results is to activate your My Chart account. Instructions are located on the last page of this paperwork. If you have not heard from us regarding the results in 2 weeks, please contact this office.     

## 2017-03-31 NOTE — Progress Notes (Signed)
12/7/20189:45 AM  Mosetta Putt Jul 13, 1970, 46 y.o. female [id number]  Chief Complaint  Patient presents with  . Headache    F/U FROM 1 MONTH AGO    HPI:   Patient is a 46 y.o. female who presents today for follow up on MRI for new onset headaches since Sept 2018, right sided, occipital-temple, no auras, no photophobia or phonophobia, no nausea, no vision changes, no hearing changes, no dizziness. Has seen optho. Reports stress as trigger. Naproxen provides partial relief.   History obtained via interpreter.  Depression screen [facility] 2/9 03/31/2017 01/26/2017 12/15/2016  Decreased Interest 0 0 0  Down, Depressed, Hopeless 0 0 0  PHQ - 2 Score 0 0 0  Altered sleeping - - -  Tired, decreased energy - - -  Change in appetite - - -  Feeling bad or failure about yourself  - - -  Trouble concentrating - - -  Moving slowly or fidgety/restless - - -  Suicidal thoughts - - -  PHQ-9 Score - - -  Difficult doing work/chores - - -    No Known Allergies  Prior to Admission medications   Medication Sig Start Date End Date Taking? Authorizing Provider  triamcinolone (NASACORT) 55 MCG/ACT AERO nasal inhaler Place 2 sprays daily into the nose. 02/28/17  Yes Rutherford Guys, MD  naproxen (NAPROSYN) 500 MG tablet Take 1 tablet (500 mg total) by mouth 2 (two) times daily with a meal. Patient not taking: Reported on 03/31/2017 01/26/17   Rutherford Guys, MD    History reviewed. No pertinent past medical history.  Past Surgical History:  Procedure Laterality Date  . RHINOPLASTY      Social History   Social History Narrative     Marital status: Married      Children:  2 children (55, 62)      Lives: with 2 children.      Employment:  Insurance claims handler; full time      Tobacco: none      Alcohol: none      Drugs: none      Exercise: none    Family History  Problem Relation Age of Onset  . Hypertension Father     ROS Per hpi  OBJECTIVE:  Blood pressure (!) 110/58, pulse 85, temperature  98.8 F (37.1 C), temperature source Oral, height 5' 2.21" (1.58 m), weight 113 lb (51.3 kg), last menstrual period 03/09/2017, SpO2 98 %.  Physical Exam  Constitutional: She is oriented to person, place, and time and well-developed, well-nourished, and in no distress.  HENT:  Head: Normocephalic and atraumatic.  Mouth/Throat: Mucous membranes are normal.  Eyes: EOM are normal. Pupils are equal, round, and reactive to light. No scleral icterus.  Neck: Neck supple.  Pulmonary/Chest: Effort normal.  Neurological: She is alert and oriented to person, place, and time. Gait normal.  Skin: Skin is warm and dry.  Psychiatric: Mood and affect normal.  Nursing note and vitals reviewed.    Mr Jeri Cos Wo Contrast  Result Date: 03/30/2017 CLINICAL DATA:  Headache disorder EXAM: MRI HEAD WITHOUT AND WITH CONTRAST TECHNIQUE: Multiplanar, multiecho pulse sequences of the brain and surrounding structures were obtained without and with intravenous contrast. CONTRAST:  26mL MULTIHANCE GADOBENATE DIMEGLUMINE 529 MG/ML IV SOLN COMPARISON:  None. FINDINGS: Brain: Ventricle size and cerebral volume normal. Small subcortical white matter hyperintensities bilaterally, mild in degree. Basal ganglia brainstem and cerebellum normal. Negative for hemorrhage or mass. No acute infarct. Normal enhancement postcontrast administration. Vascular:  Normal arterial flow voids.  Normal venous enhancement. Skull and upper cervical spine: Negative Sinuses/Orbits: Mild mucosal edema paranasal sinuses.  Normal orbit. Other: None IMPRESSION: No acute abnormality. Mild chronic white matter changes bilaterally. These may be related to chronic ischemia or migraine headaches among other causes. Electronically Signed   By: Franchot Gallo M.D.   On: 03/30/2017 11:15     ASSESSMENT and PLAN  1. Headache disorder - Ambulatory referral to Neurology New meds r/se/b discussed. RTC precautions reviewed.  - naproxen (NAPROSYN) 500 MG tablet;  Take 1 tablet (500 mg total) by mouth 2 (two) times daily with a meal. - amitriptyline (ELAVIL) 25 MG tablet; Take 1 tablet (25 mg total) by mouth at bedtime.  Return in about 3 months (around 06/29/2017).    Rutherford Guys, MD Primary Care at [place], [place] [address] Ph.  [phone] Fax [phone]

## 2017-06-30 ENCOUNTER — Encounter: Payer: Self-pay | Admitting: Family Medicine

## 2017-06-30 ENCOUNTER — Other Ambulatory Visit: Payer: Self-pay

## 2017-06-30 ENCOUNTER — Ambulatory Visit (INDEPENDENT_AMBULATORY_CARE_PROVIDER_SITE_OTHER): Payer: BLUE CROSS/BLUE SHIELD | Admitting: Family Medicine

## 2017-06-30 VITALS — BP 110/62 | HR 84 | Temp 99.4°F | Ht 63.0 in | Wt 117.4 lb

## 2017-06-30 DIAGNOSIS — R51 Headache: Secondary | ICD-10-CM | POA: Diagnosis not present

## 2017-06-30 DIAGNOSIS — R519 Headache, unspecified: Secondary | ICD-10-CM

## 2017-06-30 NOTE — Patient Instructions (Signed)
     IF you received an x-ray today, you will receive an invoice from [facility]. Please contact [facility] at [phone] with questions or concerns regarding your invoice.   IF you received labwork today, you will receive an invoice from [facility]. Please contact [facility] at [phone] with questions or concerns regarding your invoice.   Our billing staff will not be able to assist you with questions regarding bills from these companies.  You will be contacted with the lab results as soon as they are available. The fastest way to get your results is to activate your My Chart account. Instructions are located on the last page of this paperwork. If you have not heard from us regarding the results in 2 weeks, please contact this office.     

## 2017-06-30 NOTE — Progress Notes (Signed)
3/8/20198:25 AM  Maria [name] 08/06/1970, 47 y.o. female [id number]  Chief Complaint  Patient presents with  . Migraine    still experiencing hedadaces. Having migraine today, also has slight temp    HPI:   Patient is a 47 y.o. female with past medical history significant for headaches who presents today for followup  I had started her on amytriptyline and naproxen, she took them for about a week, was doing ok with them, but then saw neurology. They stopped medications and prescribed zonisamide 50mg  at bedtime and baclofen 10mg  prn. She took for a week, had some vague side effects to zonisamide, so stopped taking them. But since then her headahches have completely resolved. So she has not been taking any medications in over a month.  She has no acute concerns today.  [employer] [place] interpreter used.  Depression screen [facility] 2/9 06/30/2017 03/31/2017 01/26/2017  Decreased Interest 0 0 0  Down, Depressed, Hopeless 0 0 0  PHQ - 2 Score 0 0 0  Altered sleeping - - -  Tired, decreased energy - - -  Change in appetite - - -  Feeling bad or failure about yourself  - - -  Trouble concentrating - - -  Moving slowly or fidgety/restless - - -  Suicidal thoughts - - -  PHQ-9 Score - - -  Difficult doing work/chores - - -    No Known Allergies  Prior to Admission medications   Medication Sig Start Date End Date Taking? Authorizing Provider  amitriptyline (ELAVIL) 25 MG tablet Take 1 tablet (25 mg total) by mouth at bedtime. 03/31/17  Yes Rutherford Guys, MD  naproxen (NAPROSYN) 500 MG tablet Take 1 tablet (500 mg total) by mouth 2 (two) times daily with a meal. 03/31/17  Yes Rutherford Guys, MD  triamcinolone (NASACORT) 55 MCG/ACT AERO nasal inhaler Place 2 sprays daily into the nose. 02/28/17  Yes Rutherford Guys, MD    History reviewed. No pertinent past medical history.  Past Surgical History:  Procedure Laterality Date  . RHINOPLASTY      Social History   Tobacco Use  .  Smoking status: Never Smoker  . Smokeless tobacco: Never Used  Substance Use Topics  . Alcohol use: Yes    Comment: occasionally    Family History  Problem Relation Age of Onset  . Hypertension Father     Review of Systems  HENT: Negative for tinnitus.   Eyes: Negative for blurred vision, double vision and photophobia.  Neurological: Negative for dizziness, tingling and headaches.     OBJECTIVE:  Blood pressure 110/62, pulse 84, temperature 99.4 F (37.4 C), temperature source Oral, height 5\' 3"  (1.6 m), weight 117 lb 6.4 oz (53.3 kg), SpO2 100 %.  Wt Readings from Last 3 Encounters:  06/30/17 117 lb 6.4 oz (53.3 kg)  03/31/17 113 lb (51.3 kg)  02/28/17 115 lb 12.8 oz (52.5 kg)    Physical Exam  Constitutional: She is oriented to person, place, and time and well-developed, well-nourished, and in no distress.  HENT:  Head: Normocephalic and atraumatic.  Mouth/Throat: Mucous membranes are normal.  Eyes: EOM are normal. Pupils are equal, round, and reactive to light. No scleral icterus.  Neck: Neck supple.  Pulmonary/Chest: Effort normal.  Neurological: She is alert and oriented to person, place, and time. Gait normal.  Skin: Skin is warm and dry.  Psychiatric: Mood and affect normal.  Nursing note and vitals reviewed.   ASSESSMENT and PLAN  1.  Headache disorder Resolved. All medications have been stopped. She is to follow-up as needed.   Return for October for Annual Physical.    Rutherford Guys, MD Primary Care at [place],  [address] Ph.  [phone] Fax [phone]

## 2017-09-12 ENCOUNTER — Encounter: Payer: Self-pay | Admitting: Family Medicine

## 2017-09-12 ENCOUNTER — Other Ambulatory Visit: Payer: Self-pay

## 2017-09-12 ENCOUNTER — Ambulatory Visit: Payer: BLUE CROSS/BLUE SHIELD | Admitting: Family Medicine

## 2017-09-12 VITALS — BP 130/82 | HR 100 | Temp 99.0°F | Ht 62.21 in | Wt 111.2 lb

## 2017-09-12 DIAGNOSIS — E042 Nontoxic multinodular goiter: Secondary | ICD-10-CM

## 2017-09-12 DIAGNOSIS — H6981 Other specified disorders of Eustachian tube, right ear: Secondary | ICD-10-CM

## 2017-09-12 DIAGNOSIS — R221 Localized swelling, mass and lump, neck: Secondary | ICD-10-CM | POA: Diagnosis not present

## 2017-09-12 LAB — POCT URINE PREGNANCY: Preg Test, Ur: NEGATIVE

## 2017-09-12 MED ORDER — TRIAMCINOLONE ACETONIDE 55 MCG/ACT NA AERO: 2.0000 | INHALATION_SPRAY | Freq: Every day | NASAL | 12 refills | Status: DC

## 2017-09-12 NOTE — Progress Notes (Signed)
5/21/201912:09 PM  Maria [name] 1971/03/19, 47 y.o. female [id number]  Chief Complaint  Patient presents with  . Pain    having ear in the right ear causing headaches, anything swallowed causes pain in the throat. This has been happening since Saturday    HPI:   Patient is a 47 y.o. female with past medical history significant for thyroid nodules with benign bx in 2017 who presents today for right ear pressure, headaches and nasal congestion for past several months. She denies any sinus pressure/pain. Denies any fever or chills. She was doing well on nasacort during the falls and stopped because symptoms resolved.   She also reports right neck mass that she first noticed several days ago She denies any pain, redness or warmth She does notice it when she swallows Patient does not smoke or chew tobacco Has no known neck radiation exposures  She does have a h/o thyroid nodules, s/o benign bx, she has never felt those before  Fall Risk  09/12/2017 06/30/2017 03/31/2017 01/26/2017 12/15/2016  Falls in the past year? No No No No No     Depression screen [facility] 2/9 09/12/2017 06/30/2017 03/31/2017  Decreased Interest 0 0 0  Down, Depressed, Hopeless 0 0 0  PHQ - 2 Score 0 0 0  Altered sleeping - - -  Tired, decreased energy - - -  Change in appetite - - -  Feeling bad or failure about yourself  - - -  Trouble concentrating - - -  Moving slowly or fidgety/restless - - -  Suicidal thoughts - - -  PHQ-9 Score - - -  Difficult doing work/chores - - -    No Known Allergies  Prior to Admission medications   Medication Sig Start Date End Date Taking? Authorizing Provider  triamcinolone (NASACORT) 55 MCG/ACT AERO nasal inhaler Place 2 sprays daily into the nose. 02/28/17  Yes Rutherford Guys, MD    History reviewed. No pertinent past medical history.  Past Surgical History:  Procedure Laterality Date  . RHINOPLASTY      Social History   Tobacco Use  . Smoking status: Never Smoker  .  Smokeless tobacco: Never Used  Substance Use Topics  . Alcohol use: Yes    Comment: occasionally    Family History  Problem Relation Age of Onset  . Hypertension Father     Review of Systems  Constitutional: Negative for chills, diaphoresis, fever, malaise/fatigue and weight loss.  HENT: Positive for congestion. Negative for ear discharge, ear pain, hearing loss, sinus pain and sore throat.   Respiratory: Negative for cough, hemoptysis, shortness of breath and stridor.   Cardiovascular: Negative for chest pain, palpitations and leg swelling.  Gastrointestinal: Negative for abdominal pain, nausea and vomiting.  Musculoskeletal: Negative for neck pain.  Neurological: Negative for speech change.     OBJECTIVE:  Blood pressure 130/82, pulse 100, temperature 99 F (37.2 C), temperature source Oral, height 5' 2.21" (1.58 m), weight 111 lb 3.2 oz (50.4 kg), last menstrual period 09/01/2017, SpO2 100 %.  Physical Exam  Constitutional: She is oriented to person, place, and time. She appears well-developed and well-nourished.  HENT:  Head: Normocephalic and atraumatic.  Right Ear: Hearing, tympanic membrane, external ear and ear canal normal.  Left Ear: Hearing, tympanic membrane, external ear and ear canal normal.  Nose: Mucosal edema and rhinorrhea present. Right sinus exhibits no maxillary sinus tenderness and no frontal sinus tenderness. Left sinus exhibits no maxillary sinus tenderness and no frontal sinus  tenderness.  Mouth/Throat: Oropharynx is clear and moist.  Eyes: Pupils are equal, round, and reactive to light. EOM are normal.  Neck: Neck supple.    Cardiovascular: Normal rate, regular rhythm and normal heart sounds. Exam reveals no gallop and no friction rub.  No murmur heard. Pulmonary/Chest: Effort normal and breath sounds normal. She has no wheezes. She has no rales.  Lymphadenopathy:    She has no cervical adenopathy.  Neurological: She is alert and oriented to  person, place, and time.  Skin: Skin is warm and dry.  Psychiatric: She has a normal mood and affect.  Nursing note and vitals reviewed.     Results for orders placed or performed in visit on 09/12/17 (from the past 24 hour(s))  POCT urine pregnancy     Status: Normal   Collection Time: 09/12/17 12:29 PM  Result Value Ref Range   Preg Test, Ur Negative Negative     ASSESSMENT and PLAN  1. Localized swelling, mass or lump of neck Unclear etiology, unable to distinguish if it arises from the thyroid or not. FU pending ct scan results.  - CBC with Differential - Comprehensive metabolic panel - CT Soft Tissue Neck W Contrast; Future - POCT urine pregnancy  2. Dysfunction of right eustachian tube Restart nasal steroid. - triamcinolone (NASACORT) 55 MCG/ACT AERO nasal inhaler; Place 2 sprays into the nose daily.  3. Multiple thyroid nodules TSH has been normal in the past, rechecking again today. - TSH   Return for after CT scan.    Rutherford Guys, MD Primary Care at [facility], [place] [address] Ph.  [phone] Fax [phone]

## 2017-09-12 NOTE — Patient Instructions (Signed)
     IF you received an x-ray today, you will receive an invoice from [facility]. Please contact [facility] at [phone] with questions or concerns regarding your invoice.   IF you received labwork today, you will receive an invoice from [facility]. Please contact [facility] at [phone] with questions or concerns regarding your invoice.   Our billing staff will not be able to assist you with questions regarding bills from these companies.  You will be contacted with the lab results as soon as they are available. The fastest way to get your results is to activate your My Chart account. Instructions are located on the last page of this paperwork. If you have not heard from us regarding the results in 2 weeks, please contact this office.     

## 2017-09-13 ENCOUNTER — Encounter (HOSPITAL_COMMUNITY): Payer: Self-pay

## 2017-09-13 ENCOUNTER — Ambulatory Visit (HOSPITAL_COMMUNITY)
Admission: RE | Admit: 2017-09-13 | Discharge: 2017-09-13 | Disposition: A | Discharge status: Home / Self Care | Payer: BLUE CROSS/BLUE SHIELD | Source: Ambulatory Visit | Attending: Family Medicine | Admitting: Family Medicine

## 2017-09-13 DIAGNOSIS — R221 Localized swelling, mass and lump, neck: Secondary | ICD-10-CM | POA: Diagnosis present

## 2017-09-13 DIAGNOSIS — E041 Nontoxic single thyroid nodule: Secondary | ICD-10-CM | POA: Insufficient documentation

## 2017-09-13 LAB — CBC WITH DIFFERENTIAL/PLATELET
Basophils Absolute: 0 10*3/uL (ref 0.0–0.2)
Basos: 0 %
EOS (ABSOLUTE): 0 10*3/uL (ref 0.0–0.4)
Eos: 0 %
Hematocrit: 38.6 % (ref 34.0–46.6)
Hemoglobin: 11.8 g/dL (ref 11.1–15.9)
Immature Grans (Abs): 0 10*3/uL (ref 0.0–0.1)
Immature Granulocytes: 0 %
Lymphocytes Absolute: 1.6 10*3/uL (ref 0.7–3.1)
Lymphs: 22 %
MCH: 30.6 pg (ref 26.6–33.0)
MCHC: 30.6 g/dL — ABNORMAL LOW (ref 31.5–35.7)
MCV: 100 fL — ABNORMAL HIGH (ref 79–97)
Monocytes Absolute: 0.5 10*3/uL (ref 0.1–0.9)
Monocytes: 6 %
Neutrophils Absolute: 5.3 10*3/uL (ref 1.4–7.0)
Neutrophils: 72 %
Platelets: 279 10*3/uL (ref 150–450)
RBC: 3.86 x10E6/uL (ref 3.77–5.28)
RDW: 13.3 % (ref 12.3–15.4)
WBC: 7.4 10*3/uL (ref 3.4–10.8)

## 2017-09-13 LAB — COMPREHENSIVE METABOLIC PANEL
ALT: 12 IU/L (ref 0–32)
AST: 14 IU/L (ref 0–40)
Albumin/Globulin Ratio: 1.3 (ref 1.2–2.2)
Albumin: 4.4 g/dL (ref 3.5–5.5)
Alkaline Phosphatase: 65 IU/L (ref 39–117)
BUN/Creatinine Ratio: 19 (ref 9–23)
BUN: 11 mg/dL (ref 6–24)
Bilirubin Total: 0.5 mg/dL (ref 0.0–1.2)
CO2: 24 mmol/L (ref 20–29)
Calcium: 9.5 mg/dL (ref 8.7–10.2)
Chloride: 102 mmol/L (ref 96–106)
Creatinine, Ser: 0.58 mg/dL (ref 0.57–1.00)
GFR calc Af Amer: 128 mL/min/{1.73_m2} (ref >59)
GFR calc non Af Amer: 111 mL/min/{1.73_m2} (ref >59)
Globulin, Total: 3.3 g/dL (ref 1.5–4.5)
Glucose: 91 mg/dL (ref 65–99)
Potassium: 4.2 mmol/L (ref 3.5–5.2)
Sodium: 143 mmol/L (ref 134–144)
Total Protein: 7.7 g/dL (ref 6.0–8.5)

## 2017-09-13 LAB — TSH: TSH: 1.67 u[IU]/mL (ref 0.450–4.500)

## 2017-09-13 MED ORDER — IOHEXOL 300 MG/ML  SOLN
75.0000 mL | Freq: Once | INTRAMUSCULAR | Status: AC | PRN
  Administered 2017-09-13: 75 mL via INTRAVENOUS

## 2017-11-10 ENCOUNTER — Other Ambulatory Visit: Payer: Self-pay

## 2017-11-10 ENCOUNTER — Encounter: Payer: Self-pay | Admitting: Family Medicine

## 2017-11-10 ENCOUNTER — Ambulatory Visit: Payer: BLUE CROSS/BLUE SHIELD | Admitting: Family Medicine

## 2017-11-10 VITALS — BP 140/91 | HR 73 | Temp 99.0°F | Ht 62.21 in | Wt 113.6 lb

## 2017-11-10 DIAGNOSIS — B079 Viral wart, unspecified: Secondary | ICD-10-CM | POA: Diagnosis not present

## 2017-11-10 NOTE — Progress Notes (Signed)
   7/19/20198:40 AM  Mosetta Putt Jul 25, 1970, 47 y.o. female [id number]  Chief Complaint  Patient presents with  . Verrucous Vulgaris    has a wart in the inside of the left nostril. Not much of a discomfort. Looking to get it removed    HPI:   Patient is a 47 y.o. female who presents today for a small wart on left nostril  Has had for about a month Had similar many years ago in same location Requesting removal Has not tried any OTC, home treatments  Fall Risk  11/10/2017 09/12/2017 06/30/2017 03/31/2017 01/26/2017  Falls in the past year? No No No No No     Depression screen [facility] 2/9 11/10/2017 09/12/2017 06/30/2017  Decreased Interest 0 0 0  Down, Depressed, Hopeless 0 0 0  PHQ - 2 Score 0 0 0  Altered sleeping - - -  Tired, decreased energy - - -  Change in appetite - - -  Feeling bad or failure about yourself  - - -  Trouble concentrating - - -  Moving slowly or fidgety/restless - - -  Suicidal thoughts - - -  PHQ-9 Score - - -  Difficult doing work/chores - - -    No Known Allergies  Prior to Admission medications   Medication Sig Start Date End Date Taking? Authorizing Provider  triamcinolone (NASACORT) 55 MCG/ACT AERO nasal inhaler Place 2 sprays into the nose daily. 09/12/17   Rutherford Guys, MD    History reviewed. No pertinent past medical history.  Past Surgical History:  Procedure Laterality Date  . RHINOPLASTY      Social History   Tobacco Use  . Smoking status: Never Smoker  . Smokeless tobacco: Never Used  Substance Use Topics  . Alcohol use: Yes    Comment: occasionally    Family History  Problem Relation Age of Onset  . Hypertension Father     ROS Per hpi  OBJECTIVE:  Blood pressure (!) 140/91 pulse 79, temperature 99 F (37.2 C), temperature source Oral, height 5' 2.21" (1.58 m), weight 113 lb 9.6 oz (51.5 kg), last menstrual period 10/31/2017, SpO2 97 %.  BP Readings from Last 3 Encounters:  11/10/17 (!) 140/91  09/12/17 130/82    06/30/17 110/62    Physical Exam Gen: AAOx3, NAD Skin: a 57mm vercous papule along external septal edge of left nostril  Procedure Note: R/SE/B of LN discussed. Verbal consent obtained. LN applied x 2 for 15 seconds. Blanching obtained. Patient tolerated procedure well. Routine aftercare instructions/precautions discussed.   ASSESSMENT and PLAN  1. Wart of face Discussed being conservative with LN due to location and need for repeat treatment.   Recheck BP at next visit  Return in about 1 month (around 12/08/2017), or if symptoms worsen or fail to improve.    Rutherford Guys, MD Primary Care at [facility], [place] [address] Ph.  [phone] Fax [phone]

## 2017-11-10 NOTE — Patient Instructions (Addendum)
1. Wart should blister, keep area cleaned and dry. It will heal on its own. We can repeat treatment in about a month. You can also try applying compound W or similar once blister heals. Return if area becomes red, warm, painful or starts having purulent drainage.     IF you received an x-ray today, you will receive an invoice from [facility]. Please contact [facility] at [phone] with questions or concerns regarding your invoice.   IF you received labwork today, you will receive an invoice from [facility]. Please contact [facility] at [phone] with questions or concerns regarding your invoice.   Our billing staff will not be able to assist you with questions regarding bills from these companies.  You will be contacted with the lab results as soon as they are available. The fastest way to get your results is to activate your My Chart account. Instructions are located on the last page of this paperwork. If you have not heard from [place] regarding the results in 2 weeks, please contact this office.

## 2017-12-13 ENCOUNTER — Encounter: Payer: Self-pay | Admitting: Family Medicine

## 2017-12-13 ENCOUNTER — Ambulatory Visit (INDEPENDENT_AMBULATORY_CARE_PROVIDER_SITE_OTHER): Payer: BLUE CROSS/BLUE SHIELD | Admitting: Family Medicine

## 2017-12-13 ENCOUNTER — Other Ambulatory Visit: Payer: Self-pay

## 2017-12-13 VITALS — BP 110/68 | HR 68 | Temp 98.6°F | Resp 16 | Ht 62.21 in | Wt 113.2 lb

## 2017-12-13 DIAGNOSIS — B079 Viral wart, unspecified: Secondary | ICD-10-CM | POA: Diagnosis not present

## 2017-12-13 NOTE — Progress Notes (Signed)
   8/21/20198:16 AM  Maria [name] [birth year], 47 y.o. female [id number]  Chief Complaint  Patient presents with  . wart    on nose follow up     HPI:   Patient is a 47 y.o. female who presents today for wart at tip of nose  Treated with LN about 6 weeks ago It blistered mildly but did not fall off She would like it treated again  Fall Risk  12/13/2017 11/10/2017 09/12/2017 06/30/2017 03/31/2017  Falls in the past year? No No No No No     Depression screen [facility] 2/9 12/13/2017 11/10/2017 09/12/2017  Decreased Interest 0 0 0  Down, Depressed, Hopeless 0 0 0  PHQ - 2 Score 0 0 0  Altered sleeping - - -  Tired, decreased energy - - -  Change in appetite - - -  Feeling bad or failure about yourself  - - -  Trouble concentrating - - -  Moving slowly or fidgety/restless - - -  Suicidal thoughts - - -  PHQ-9 Score - - -  Difficult doing work/chores - - -    No Known Allergies  Prior to Admission medications   Medication Sig Start Date End Date Taking? Authorizing Provider  triamcinolone (NASACORT) 55 MCG/ACT AERO nasal inhaler Place 2 sprays into the nose daily. 09/12/17  Yes Rutherford Guys, MD    History reviewed. No pertinent past medical history.  Past Surgical History:  Procedure Laterality Date  . RHINOPLASTY      Social History   Tobacco Use  . Smoking status: Never Smoker  . Smokeless tobacco: Never Used  Substance Use Topics  . Alcohol use: Yes    Comment: occasionally    Family History  Problem Relation Age of Onset  . Hypertension Father     ROS Per hpi  OBJECTIVE:  Blood pressure 110/68, pulse 68, temperature 98.6 F (37 C), resp. rate 16, height 5' 2.21" (1.58 m), weight 113 lb 3.2 oz (51.3 kg), SpO2 99 %. Body mass index is 20.57 kg/m.   Physical Exam   Physical Exam Gen: AAOx3, NAD Skin: a 4mm vercous papule along external septal edge of left nostril  Procedure Note: R/SE/B of LN discussed. Verbal consent obtained. LN applied for 3 freeze  and thaw cycles. Patient tolerated procedure well. Routine aftercare instructions/precautions discussed.   ASSESSMENT and PLAN  1. Wart of face Treated with LN for second time. Consider referral.  Return if symptoms worsen or fail to improve.    Rutherford Guys, MD Primary Care at [place], [place] [address] Ph.  [phone] Fax [phone]

## 2017-12-13 NOTE — Patient Instructions (Signed)
° ° ° °  If you have lab work done today you will be contacted with your lab results within the next 2 weeks.  If you have not heard from us then please contact us. The fastest way to get your results is to register for My Chart. ° ° °IF you received an x-ray today, you will receive an invoice from [facility]. Please contact [facility] at [phone] with questions or concerns regarding your invoice.  ° °IF you received labwork today, you will receive an invoice from [facility]. Please contact [facility] at [phone] with questions or concerns regarding your invoice.  ° °Our billing staff will not be able to assist you with questions regarding bills from these companies. ° °You will be contacted with the lab results as soon as they are available. The fastest way to get your results is to activate your My Chart account. Instructions are located on the last page of this paperwork. If you have not heard from us regarding the results in 2 weeks, please contact this office. °  ° ° ° °

## 2017-12-20 ENCOUNTER — Other Ambulatory Visit: Payer: Self-pay

## 2017-12-20 ENCOUNTER — Encounter: Payer: Self-pay | Admitting: Family Medicine

## 2017-12-20 ENCOUNTER — Ambulatory Visit: Payer: BLUE CROSS/BLUE SHIELD | Admitting: Family Medicine

## 2017-12-20 VITALS — BP 127/83 | HR 65 | Temp 98.6°F | Ht 62.0 in | Wt 116.0 lb

## 2017-12-20 DIAGNOSIS — B079 Viral wart, unspecified: Secondary | ICD-10-CM

## 2017-12-20 NOTE — Progress Notes (Signed)
Patient here for followup after cryo therapy of wart on left nostril No signs or symptoms of infection During exam it came off completely She is doing well No other concerns today

## 2017-12-20 NOTE — Patient Instructions (Signed)
° ° ° °  If you have lab work done today you will be contacted with your lab results within the next 2 weeks.  If you have not heard from us then please contact us. The fastest way to get your results is to register for My Chart. ° ° °IF you received an x-ray today, you will receive an invoice from [facility]. Please contact [facility] at [phone] with questions or concerns regarding your invoice.  ° °IF you received labwork today, you will receive an invoice from [facility]. Please contact [facility] at [phone] with questions or concerns regarding your invoice.  ° °Our billing staff will not be able to assist you with questions regarding bills from these companies. ° °You will be contacted with the lab results as soon as they are available. The fastest way to get your results is to activate your My Chart account. Instructions are located on the last page of this paperwork. If you have not heard from us regarding the results in 2 weeks, please contact this office. °  ° ° ° °

## 2018-01-30 ENCOUNTER — Encounter: Payer: BLUE CROSS/BLUE SHIELD | Admitting: Family Medicine

## 2018-03-21 ENCOUNTER — Other Ambulatory Visit: Payer: Self-pay

## 2018-03-21 ENCOUNTER — Encounter: Payer: Self-pay | Admitting: Family Medicine

## 2018-03-21 ENCOUNTER — Ambulatory Visit (INDEPENDENT_AMBULATORY_CARE_PROVIDER_SITE_OTHER): Payer: BLUE CROSS/BLUE SHIELD | Admitting: Family Medicine

## 2018-03-21 VITALS — BP 138/82 | HR 82 | Temp 98.6°F | Ht 62.0 in | Wt 115.0 lb

## 2018-03-21 DIAGNOSIS — Z1329 Encounter for screening for other suspected endocrine disorder: Secondary | ICD-10-CM

## 2018-03-21 DIAGNOSIS — Z0000 Encounter for general adult medical examination without abnormal findings: Secondary | ICD-10-CM | POA: Diagnosis not present

## 2018-03-21 DIAGNOSIS — Z1231 Encounter for screening mammogram for malignant neoplasm of breast: Secondary | ICD-10-CM

## 2018-03-21 DIAGNOSIS — Z130 Encounter for screening for diseases of the blood and blood-forming organs and certain disorders involving the immune mechanism: Secondary | ICD-10-CM

## 2018-03-21 DIAGNOSIS — Z13220 Encounter for screening for lipoid disorders: Secondary | ICD-10-CM

## 2018-03-21 DIAGNOSIS — Z13228 Encounter for screening for other metabolic disorders: Secondary | ICD-10-CM

## 2018-03-21 MED ORDER — TRIAMCINOLONE ACETONIDE 55 MCG/ACT NA AERO: 2.0000 | INHALATION_SPRAY | Freq: Every day | NASAL | 12 refills | Status: DC

## 2018-03-21 NOTE — Progress Notes (Signed)
11/27/20191:25 PM  DEMARIE UHLIG 1970/06/12, 48 y.o. female [id number]  Chief Complaint  Patient presents with  . Annual Exam  . Medication Refill    nasacort    HPI:   Patient is a 47 y.o. female who presents today for CPE  Last CPE Oct 2018 Cervical Cancer Screening: April 2018 Breast Cancer Screening: 2016, requesting mammogram today Colorectal Cancer Screening: at age 7 Bone Density Testing: at age 47 HIV Screening: 2018 STI Screening: 2018 Seasonal Influenza Vaccination: declines Td/Tdap Vaccination: 2014 Pneumococcal Vaccination: at age 75 Zoster Vaccination: at age 4 Frequency of Dental evaluation: Q6 months Frequency of Eye evaluation: yearly Uses condoms  Feeling tired, as if she is getting sick Has been taking OTC meds for past 2 weeks Coughing, sore throat, nasal congestion Had minimal scant amount of blood with cough No fever or chills or night sweats No SOB  [place] interpreter used, stratus  Fall Risk  12/20/2017 12/13/2017 11/10/2017 09/12/2017 06/30/2017  Falls in the past year? _0      Depression screen [facility] 2/9 12/20/2017 12/13/2017 11/10/2017  Decreased Interest - 0 0  Down, Depressed, Hopeless 0 0 0  PHQ - 2 Score 0 0 0  Altered sleeping - - -  Tired, decreased energy - - -  Change in appetite - - -  Feeling bad or failure about yourself  - - -  Trouble concentrating - - -  Moving slowly or fidgety/restless - - -  Suicidal thoughts - - -  PHQ-9 Score - - -  Difficult doing work/chores - - -    No Known Allergies  Prior to Admission medications   Medication Sig Start Date End Date Taking? Authorizing Provider  triamcinolone (NASACORT) 55 MCG/ACT AERO nasal inhaler Place 2 sprays into the nose daily. 09/12/17   Rutherford Guys, MD    No past medical history on file.  Past Surgical History:  Procedure Laterality Date  . RHINOPLASTY      Social History   Tobacco Use  . Smoking status: Never Smoker  . Smokeless tobacco:  Never Used  Substance Use Topics  . Alcohol use: Yes    Comment: occasionally    Family History  Problem Relation Age of Onset  . Hypertension Father     Review of Systems  Constitutional: Negative for chills and fever.  Respiratory: Negative for cough and shortness of breath.   Cardiovascular: Negative for chest pain, palpitations and leg swelling.  Gastrointestinal: Negative for abdominal pain, nausea and vomiting.  All other systems reviewed and are negative.  Per hpi  OBJECTIVE:  Blood pressure 138/82, pulse 82, temperature 98.6 F (37 C), temperature source Oral, height _1  (1.575 m), weight 115 lb (52.2 kg), last menstrual period 03/21/2018, SpO2 99 %. Body mass index is 21.03 kg/m.    Visual Acuity Screening   Right eye Left eye Both eyes  Without correction:     With correction: _2   Physical Exam  Constitutional: She is oriented to person, place, and time. She appears well-developed and well-nourished.  HENT:  Head: Normocephalic and atraumatic.  Right Ear: Hearing, tympanic membrane, external ear and ear canal normal.  Left Ear: Hearing, tympanic membrane, external ear and ear canal normal.  Nose: Epistaxis (small areas on both nostrils of small  areas of recent bleding) is observed.  Mouth/Throat: Oropharynx is clear and moist.  Eyes: Pupils are equal, round, and reactive to light. Conjunctivae and EOM are normal.  Neck: Neck supple. No thyromegaly present.  Cardiovascular: Normal rate, regular rhythm, normal heart sounds and intact distal pulses. Exam reveals no gallop and no friction rub.  No murmur heard. Pulmonary/Chest: Effort normal and breath sounds normal. She has no wheezes. She has no rales.  Abdominal: Soft. Bowel sounds are normal. She exhibits no distension and no mass. There is no tenderness.  Musculoskeletal: Normal range of motion. She exhibits no edema.  Lymphadenopathy:    She has no cervical adenopathy.  Neurological: She  is alert and oriented to person, place, and time. She has normal reflexes.  Skin: Skin is warm and dry.  Psychiatric: She has a normal mood and affect.  Nursing note and vitals reviewed.   ASSESSMENT and PLAN  1. Annual physical exam No concerns per history or exam. Routine HCM labs ordered. HCM reviewed/discussed. Anticipatory guidance regarding healthy weight, lifestyle and choices given.   - Care order/instruction:  2. Screening for endocrine, metabolic and immunity disorder - CMP14+EGFR  3. Screening for deficiency anemia - CBC with Differential/Platelet  4. Screening for thyroid disorder - TSH  5. Screening for lipid disorders - Lipid panel  6. Visit for screening mammogram - MM DIGITAL SCREENING BILATERAL; Future  Other orders - triamcinolone (NASACORT) 55 MCG/ACT AERO nasal inhaler; Place 2 sprays into the nose daily.  Return in about 1 year (around 03/22/2019) for CPE.    Rutherford Guys, MD Primary Care at [facility] [place], [place] [address] Ph.  [phone] Fax [phone]

## 2018-03-21 NOTE — Patient Instructions (Addendum)
Next pap in April 2021    Ch?m [place] phng ng?a 40-64 tu?i, N? gi?i Preventive Care 40-64 Years, Female Ch?m [place] phng ng?a ?? c?p ??n cc l?a ch?n l?i s?ng v th?m khm v?i chuyn gia ch?m [place] s?c kh?e c th? t?ng c??ng s?c kh?e v h?nh phc. Ch?m [place] phng ng?a bao g?m nh?ng g?  Khm s?c kh?e hng n?m. L?n khm ny c?ng ???c g?i l ki?m tra s?c kh?e hng n?m.  Khm nha khoa m?i n?m m?t ho?c hai l?n.  Khm m?t ??nh k?. Hy h?i chuyn gia ch?m [place] s?c kh?e v? vi?c bao lu th qu v? nn khm m?t m?t l?n.  Cc l?a ch?n l?i s?ng c nhn, bao g?m: ? Ch?m [place] r?ng v l?i hng ngy. ? Hoa?t ??ng th? ch?t th???ng xuyn. ? ?n ch? ?? ?n lnh m?nh. ? Hessie Diener s? d?ng thu?c l v ma ty. ? H?n ch? s? d?ng r??u. ? Quan h? tnh d?c an ton. ? Dng aspirin li?u th?p hng ngy b?t ??u ? tu?i 50. ? Dng th?c ph?m b? sung vitamin v ch?t khong theo ch? d?n c?a chuyn gia ch?m [place] s?c kh?e. Nh?ng g x?y ra trong khi ki?m tra s?c kh?e hng n?m? Cc d?ch v? v xt nghi?m sng l?c do chuyn gia ch?m [place] s?c kh?e th?c hi?n trong khi khm s?c kh?e hng n?m s? ph? thu?c vo tu?i, s?c kh?e t?ng th?, cc y?u t? nguy c? t? l?i s?ng v b?nh s? gia ?nh c?a qu v?. T? v?n [place] gia ch?m [place] s?c kh?e c th? h?i qu v? v? vi?c:  S? d?ng r??u.  S? d?ng thu?c l.  S? d?ng ma ty.  Tnh tr?ng s?c kh?e tinh th?n.  Tnh tr?ng h?nh phc ? nh v trong m?i quan h?.  Quan h? tnh d?c.  Thi quen ?n u?ng.  Cng vi?c v mi tr??ng lm vi?c.  Ph??ng php ng?a [place].  Chu k? kinh nguy?t.  L?ch s? mang thai.  Sng l?c Qu v? c th? ???c lm cc xt nghi?m ho?c php ?o sau:  Chi?u cao, cn n?ng v BMI.  Huy?t p.  N?ng ?? lipid v cholesterol. L??ng cc ch?t ny c th? ???c ki?m tra m?i 5 n?m m?t l?n ho?c th??ng xuyn h?n n?u qu v? trn [place].  Sng l?c ung th? ph?i. Qu v? c th? ???c lm xt nghi?m sng l?c ny m?i n?m b?t ??u ? tu?i 56 n?u qu v? c ti?n s? ht 30 gi thu?c m?t n?m  v hi?n ?ang ht thu?c ho?c ? b? thu?c trong vng 15 n?m tr??c.  Xt nghi?m mu ?n trong phn (FOBT). Qu v? c th? ???c lm xt nghi?m ny m?i n?m b?t ??u ? tu?i 50.  N?i soi ??i trng ho?c n?i soi ??i trng sigma ?ng m?m. Qu v? c th? ???c n?i soi ??i trng sigma m?i 5 n?m m?t l?n ho?c n?i soi ??i trng m?i 10 n?m m?t l?n b?t ??u ? tu?i 50.  Xt nghi?m mu tm vim gan C.  Xt nghi?m mu tm vim gan B.  Xt nghi?m b?nh ly truy?n qua ???ng tnh d?c (STD).  Sng l?c ti?u ???ng. Xt nghi?m ny ???c th?c hi?n b?ng cch ki?m tra ???ng huy?t (glucose) sau khi qu v? khng ?n g trong m?t kho?ng th?i gian (nh?n ?i). Qu v? c th? ???c lm xt nghi?m ny m?i 1-3 n?m m?t l?n.  Ch?p X quang tuy?n v. Xt nghi?m  ny c th? ???c th?c hi?n m?i 1-2 n?m m?t l?n. Hy trao ??i v?i chuyn gia ch?m [place] s?c kh?e v? vi?c khi no qu v? nn b?t ??u ch?p X quang tuy?n v hng n?m. ?i?u ny c th? ph? thu?c vo vi?c qu v? c ti?n s? gia ?nh b? ung th? v hay khng.  Sng l?c ung th? lin quan ??n BRCA. Xt nghi?m ny c th? ???c th?c hi?n n?u qu v? c ti?n s? gia ?nh b? ung th? v, bu?ng tr?ng, ?ng d?n tr?ng ho?c ung th? phc m?c.  Khm khung ch?u v xt nghi?m ph?t t? bo c? t? cung (Pap). Nh?ng xt nghi?m ny c th? ???c th?c hi?n m?i 3 n?m m?t l?n b?t ??u t? tu?i 21. B?t ??u ? tu?i 30, xt nghi?m ny c th? ???c th?c hi?n m?i 5 n?m m?t l?n n?u qu v? ???c lm xt nghi?m Pap k?t h?p v?i xt nghi?m HPV.  Ch?p m?t ?? x??ng. Xt nghi?m ny ???c th?c hi?n ?? sng l?c b?nh long x??ng. Qu v? c th? ???c ch?p m?t ?? x??ng n?u c nguy c? cao b? long x??ng.  Hy th?o lu?n v?i chuyn gia ch?m [place] s?c kh?e cu?a qu v? v? cc k?t qu? xt nghi?m, cc ph??ng n ?i?u tr? v n?u c?n, nhu c?u lm thm xt nghi?m. V?c xin Chu[place] gia ch?m [place] s?c kh?e c th? khuy?n ngh? m?t s? v?c xin nh?t ??nh, ch?ng h?n nh?:  V?c xin cm. V?c xin ny ???c khuy?n ngh? hng n?m.  V?c xin u?n vn, b?ch h?u v ho g v bo (Tdap,  Td). Qu v? c th? c?n m?t li?u Td nh?c l?i m?i 10 n?m m?t l?n.  V?c xin th?y ??u. Qu v? c th? c?n v?c xin ny n?u ch?a ???c chch ng?a.  V?c xin zoster. Qu v? c th? c?n v?c xin ny sau tu?i 60.  V?c xin s?i, quai b? v rubella (MMR). Qu v? c th? c?n t nh?t m?t li?u MMR n?u sinh ra vo n?m 1957 ho?c sau ?Sander Nephew v? c?ng c?n li?u th? hai.  V?c xin lin h?p ph? c?u khu?n 13 gi (PCV13). Qu v? c th? c?n v?c xin ny n?u c m?t s? tnh tr?ng b?nh l nh?t ??nh v tr??c ?y ch?a ???c chch ng?a.  V?c xin ph? c?u khu?n polysaccharide (PPSV23). Qu v? c th? c?n m?t ho?c hai li?u n?u qu v? ht thu?c ho?c n?u qu v? c m?t s? tnh tr?ng b?nh l nh?t ??nh.  V?c xin vim mng no. Qu v? c th? c?n v?c xin ny n?u c cc tnh tr?ng nh?t ??nh.  V?c xin vim gan A. Qu v? c th? c?n v?c xin ny n?u c m?t s? tnh tr?ng nh?t ??nh ho?c n?u qu v? ?i du l?ch ho?c lm vi?c ? nh?ng n?i m qu v? c th? b? ph?i nhi?m vim gan A.  V?c xin vim gan B. Qu v? c th? c?n v?c xin ny n?u c m?t s? tnh tr?ng b?nh l nh?t ??nh ho?c n?u qu v? ?i du l?ch ho?c lm vi?c ? nh?ng n?i m qu v? c th? b? ph?i nhi?m vim gan B.  V?c xin Haemophilus influenzae tup b (Hib). Qu v? c th? c?n v?c xin ny n?u c cc tnh tr?ng nh?t ??nh.  Hy trao ??i v?i chuyn gia ch?m [place] s?c kh?e v? nh?ng xt nghi?m sng l?c v v?c xin no m qu v? c?n v bao lu th c?n  cc v?c xin ? m?t l?n. Thng tin ny khng nh?m m?c ?ch thay th? cho l?i khuyn m chuyn gia ch?m [place] s?c kh?e ni v?i qu v?. Hy b?o ??m qu v? ph?i th?o lu?n b?t k? v?n ?? g m qu v? c v?i chuyn gia ch?m [place] s?c kh?e c?a qu v?. Document Released: 07/28/2016 Document Revised: 07/28/2016 Elsevier Interactive Patient Education  [employer].

## 2018-03-22 LAB — CMP14+EGFR
ALT: 12 IU/L (ref 0–32)
AST: 17 IU/L (ref 0–40)
Albumin/Globulin Ratio: 1.4 (ref 1.2–2.2)
Albumin: 4.6 g/dL (ref 3.5–5.5)
Alkaline Phosphatase: 65 IU/L (ref 39–117)
BUN/Creatinine Ratio: 16 (ref 9–23)
BUN: 11 mg/dL (ref 6–24)
Bilirubin Total: 0.3 mg/dL (ref 0.0–1.2)
CO2: 21 mmol/L (ref 20–29)
Calcium: 9.3 mg/dL (ref 8.7–10.2)
Chloride: 103 mmol/L (ref 96–106)
Creatinine, Ser: 0.69 mg/dL (ref 0.57–1.00)
GFR calc Af Amer: 121 mL/min/{1.73_m2} (ref >59)
GFR calc non Af Amer: 105 mL/min/{1.73_m2} (ref >59)
Globulin, Total: 3.2 g/dL (ref 1.5–4.5)
Glucose: 81 mg/dL (ref 65–99)
Potassium: 4 mmol/L (ref 3.5–5.2)
Sodium: 139 mmol/L (ref 134–144)
Total Protein: 7.8 g/dL (ref 6.0–8.5)

## 2018-03-22 LAB — CBC WITH DIFFERENTIAL/PLATELET
Basophils Absolute: 0 10*3/uL (ref 0.0–0.2)
Basos: 0 %
EOS (ABSOLUTE): 0.1 10*3/uL (ref 0.0–0.4)
Eos: 2 %
Hematocrit: 40 % (ref 34.0–46.6)
Hemoglobin: 13.4 g/dL (ref 11.1–15.9)
Immature Grans (Abs): 0 10*3/uL (ref 0.0–0.1)
Immature Granulocytes: 0 %
Lymphocytes Absolute: 1.8 10*3/uL (ref 0.7–3.1)
Lymphs: 38 %
MCH: 32.4 pg (ref 26.6–33.0)
MCHC: 33.5 g/dL (ref 31.5–35.7)
MCV: 97 fL (ref 79–97)
Monocytes Absolute: 0.5 10*3/uL (ref 0.1–0.9)
Monocytes: 10 %
Neutrophils Absolute: 2.4 10*3/uL (ref 1.4–7.0)
Neutrophils: 50 %
Platelets: 214 10*3/uL (ref 150–450)
RBC: 4.14 x10E6/uL (ref 3.77–5.28)
RDW: 12 % — ABNORMAL LOW (ref 12.3–15.4)
WBC: 4.8 10*3/uL (ref 3.4–10.8)

## 2018-03-22 LAB — LIPID PANEL
Chol/HDL Ratio: 2.8 ratio (ref 0.0–4.4)
Cholesterol, Total: 205 mg/dL — ABNORMAL HIGH (ref 100–199)
HDL: 73 mg/dL (ref >39)
LDL Calculated: 112 mg/dL — ABNORMAL HIGH (ref 0–99)
Triglycerides: 101 mg/dL (ref 0–149)
VLDL Cholesterol Cal: 20 mg/dL (ref 5–40)

## 2018-03-22 LAB — TSH: TSH: 1.73 u[IU]/mL (ref 0.450–4.500)

## 2018-04-03 IMAGING — MR MR HEAD WO/W CM
12 series · 48 of 48 positions shown · IV contrast (10ml multihance)
Comparison: None.

CLINICAL DATA: Headache disorder

EXAM:
MRI HEAD WITHOUT AND WITH CONTRAST
TECHNIQUE: Multiplanar, multiecho pulse sequences of the brain and surrounding
structures were obtained without and with intravenous contrast.
CONTRAST:  10mL MULTIHANCE GADOBENATE DIMEGLUMINE 529 MG/ML IV SOLN

[Series 2: T1 · sagittal · 5.0mm · 0.45mm/px · 1 of 23 slices shown]
[im 1/23]
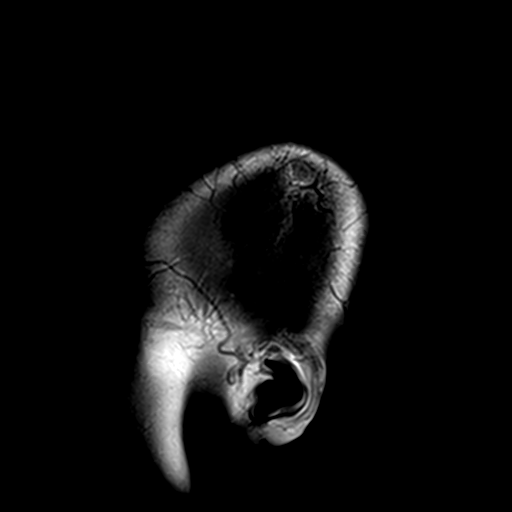

[Series 3: DWI · axial · 3.0mm · 1.80mm/px · z∈[-87,+57]mm · 7 of 100 slices shown (1 of 4)]
[im 1/100]
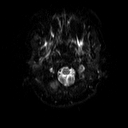
[im 17/100]
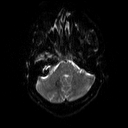
[im 34/100]
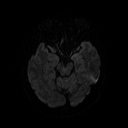
[im 50/100]
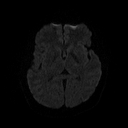
[im 67/100]
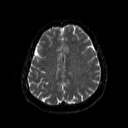
[im 83/100]
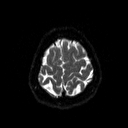
[im 100/100]
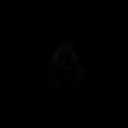

[Series 4: DWI · axial · 3.0mm · 1.80mm/px · z∈[-87,+57]mm · 3 of 45 slices shown (2 of 4)]
[im 1/45]
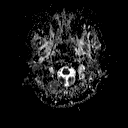
[im 23/45]
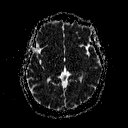
[im 45/45]
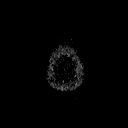

[Series 5: DWI · coronal · 5.0mm · 1.80mm/px · 5 of 66 slices shown (3 of 4)]
[im 1/66]
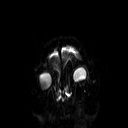
[im 17/66]
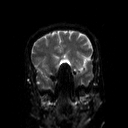
[im 33/66]
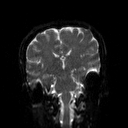
[im 49/66]
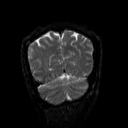
[im 66/66]
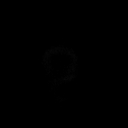

[Series 6: DWI · coronal · 5.0mm · 1.80mm/px · 2 of 34 slices shown (4 of 4)]
[im 1/34]
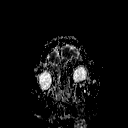
[im 34/34]
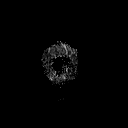

[Series 7: T2 · axial · 5.0mm · 0.51mm/px · z∈[-81,+58]mm · 2 of 22 slices shown (1 of 2)]
[im 1/22]
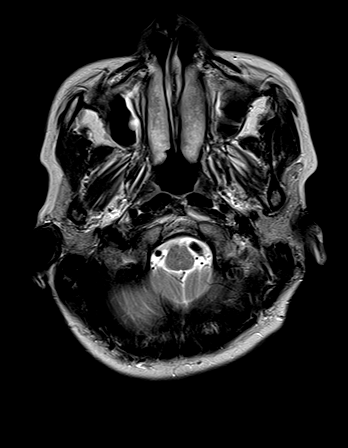
[im 22/22]
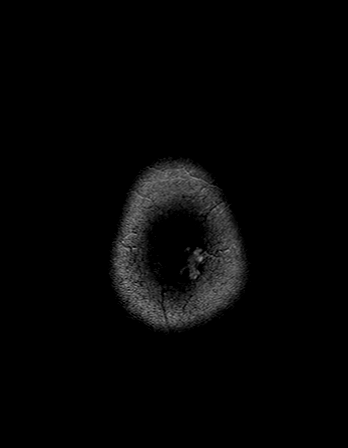

[Series 8: FLAIR · axial · 3.0mm · 0.45mm/px · z∈[-93,+54]mm · 2 of 33 slices shown]
[im 1/33]
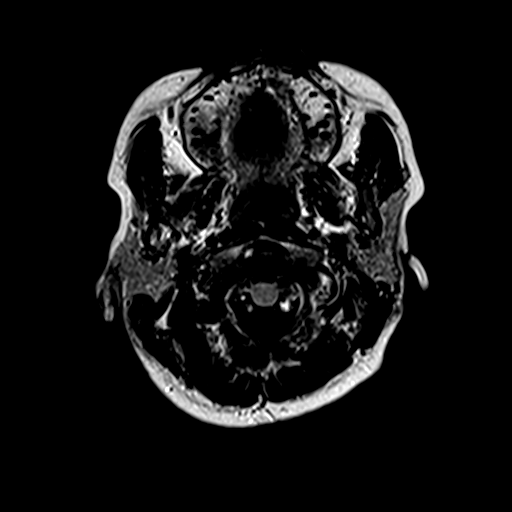
[im 33/33]
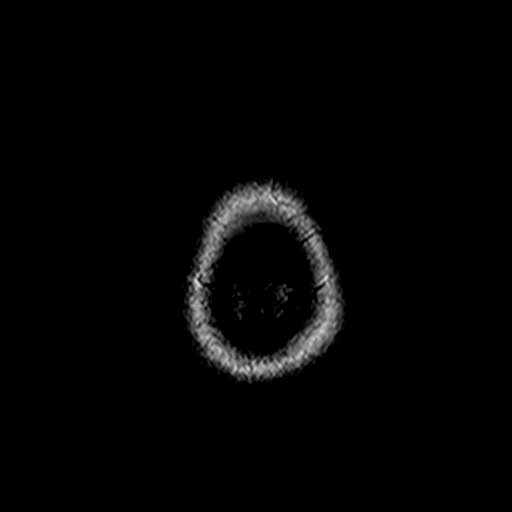

[Series 10: swi_images · axial · 5.0mm · 0.90mm/px · z∈[-90,+52]mm · 2 of 30 slices shown]
[im 1/30]
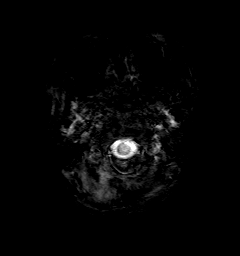
[im 30/30]
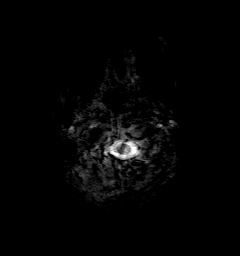

[Series 11: t1_mpr_tra · axial · 1.0mm · 0.75mm/px · z∈[-89,+52]mm · 10 of 144 slices shown (1 of 2)]
[im 1/144]
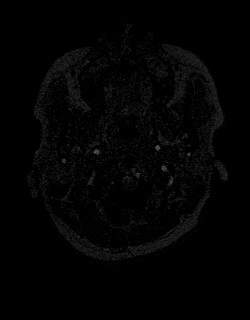
[im 16/144]
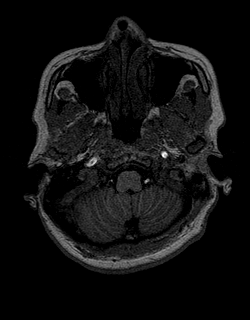
[im 32/144]
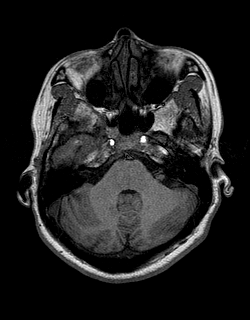
[im 48/144]
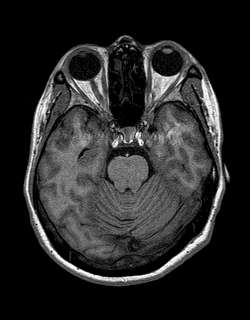
[im 64/144]
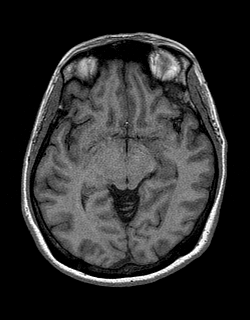
[im 80/144]
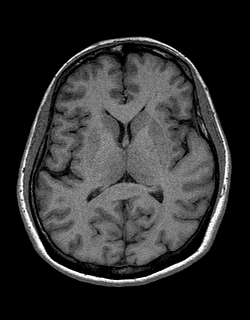
[im 96/144]
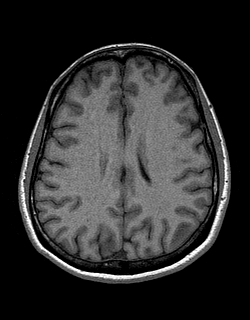
[im 112/144]
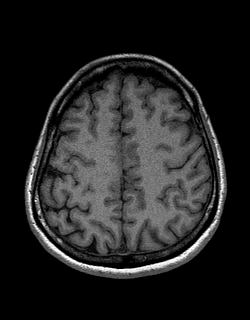
[im 128/144]
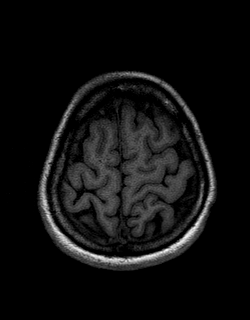
[im 144/144]
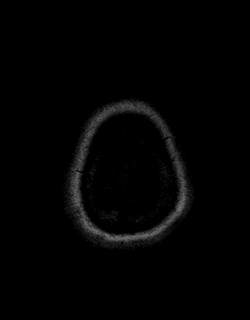

[Series 12: T2 · coronal · 5.0mm · 0.45mm/px · 2 of 27 slices shown (2 of 2)]
[im 1/27]
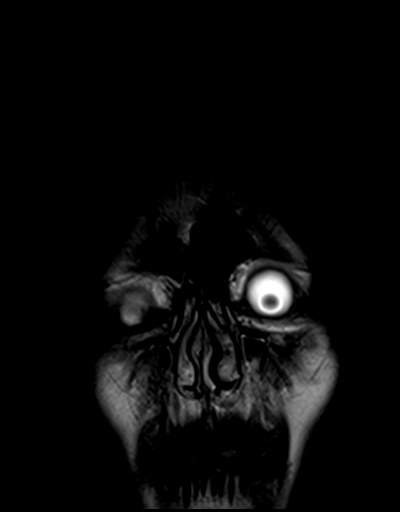
[im 27/27]
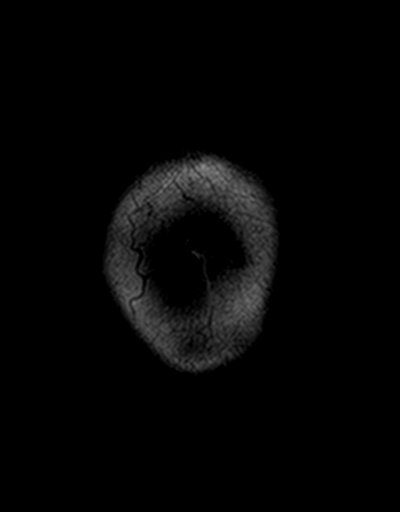

[Series 13: t1_mpr_tra · axial · 1.0mm · 0.75mm/px · z∈[-89,+52]mm · 10 of 144 slices shown (2 of 2)]
[im 1/144]
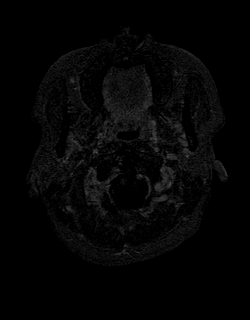
[im 16/144]
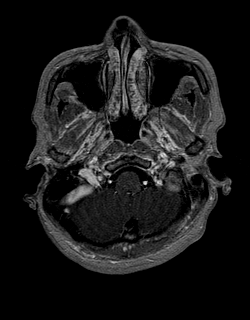
[im 32/144]
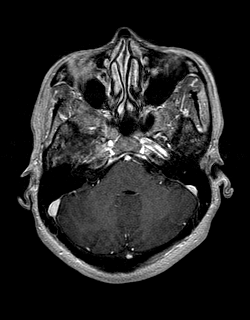
[im 48/144]
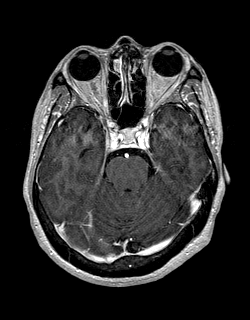
[im 64/144]
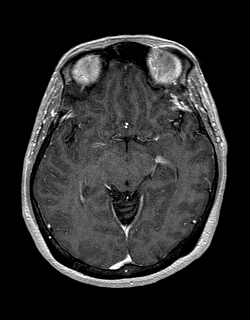
[im 80/144]
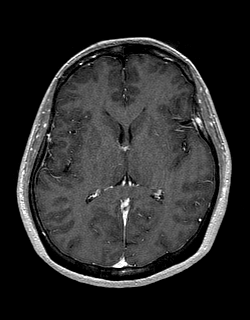
[im 96/144]
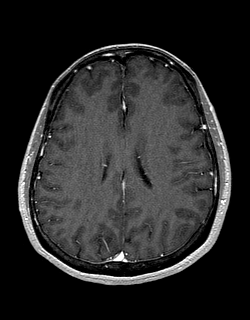
[im 112/144]
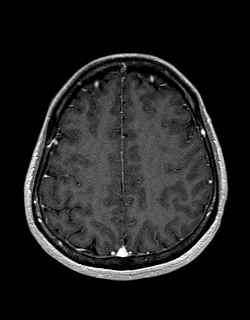
[im 128/144]
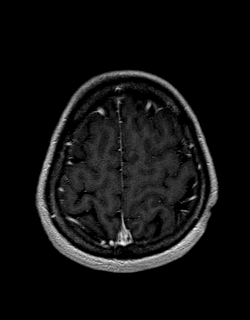
[im 144/144]
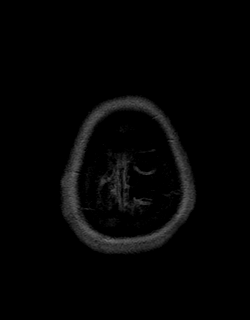

[Series 14: post cor · coronal · 5.0mm · 0.45mm/px · 2 of 27 slices shown]
[im 1/27]
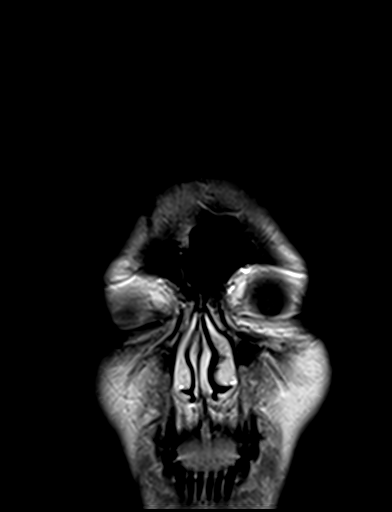
[im 27/27]
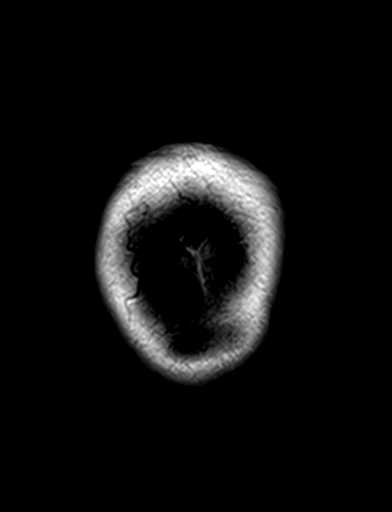

[48 of 48 positions shown; findings below may reference images not displayed]

FINDINGS: Brain: Ventricle size and cerebral volume normal. Small subcortical
white matter hyperintensities bilaterally, mild in degree. Basal
ganglia brainstem and cerebellum normal. Negative for hemorrhage or
mass. No acute infarct. Normal enhancement postcontrast
administration.

Vascular: Normal arterial flow voids.  Normal venous enhancement.

Skull and upper cervical spine: Negative

Sinuses/Orbits: Mild mucosal edema paranasal sinuses.  Normal orbit.

Other: None
IMPRESSION: No acute abnormality. Mild chronic white matter changes bilaterally.
These may be related to chronic ischemia or migraine headaches among
other causes.

## 2020-01-30 ENCOUNTER — Encounter: Payer: BLUE CROSS/BLUE SHIELD | Admitting: Family Medicine

## 2020-02-14 ENCOUNTER — Ambulatory Visit: Payer: 59 | Admitting: Family Medicine

## 2020-02-14 ENCOUNTER — Other Ambulatory Visit: Payer: Self-pay

## 2020-02-14 NOTE — Progress Notes (Signed)
Patient ID: Maria [name], female    DOB: [birth year]  Age: 49 y.o. MRN: [id number]  Chief Complaint  Patient presents with   Annual Exam    Subjective:    Current allergies, medications, problem list, past/family and social histories reviewed.  Objective:  There were no vitals taken for this visit.   Assessment & Plan:   Assessment: No diagnosis found.    Plan:  No orders of the defined types were placed in this encounter.   No orders of the defined types were placed in this encounter.        There are no Patient Instructions on file for this visit.   No follow-ups on file.   Ruben Reason, MD 02/14/2020

## 2020-02-21 ENCOUNTER — Encounter: Payer: Self-pay | Admitting: Family Medicine

## 2020-04-07 ENCOUNTER — Ambulatory Visit (INDEPENDENT_AMBULATORY_CARE_PROVIDER_SITE_OTHER): Payer: 59 | Admitting: Family Medicine

## 2020-04-07 ENCOUNTER — Encounter: Payer: Self-pay | Admitting: Family Medicine

## 2020-04-07 ENCOUNTER — Other Ambulatory Visit: Payer: Self-pay

## 2020-04-07 VITALS — BP 150/80 | HR 84 | Temp 98.4°F | Ht 62.0 in | Wt 112.0 lb

## 2020-04-07 DIAGNOSIS — I10 Essential (primary) hypertension: Secondary | ICD-10-CM

## 2020-04-07 DIAGNOSIS — Z0001 Encounter for general adult medical examination with abnormal findings: Secondary | ICD-10-CM | POA: Diagnosis not present

## 2020-04-07 DIAGNOSIS — Z1159 Encounter for screening for other viral diseases: Secondary | ICD-10-CM | POA: Diagnosis not present

## 2020-04-07 DIAGNOSIS — R350 Frequency of micturition: Secondary | ICD-10-CM

## 2020-04-07 DIAGNOSIS — Z1231 Encounter for screening mammogram for malignant neoplasm of breast: Secondary | ICD-10-CM | POA: Diagnosis not present

## 2020-04-07 DIAGNOSIS — Z0000 Encounter for general adult medical examination without abnormal findings: Secondary | ICD-10-CM

## 2020-04-07 LAB — POCT URINALYSIS DIP (MANUAL ENTRY)
Bilirubin, UA: NEGATIVE
Blood, UA: NEGATIVE
Glucose, UA: NEGATIVE mg/dL
Ketones, POC UA: NEGATIVE mg/dL
Leukocytes, UA: NEGATIVE
Nitrite, UA: NEGATIVE
Protein Ur, POC: NEGATIVE mg/dL
Spec Grav, UA: 1.025 (ref 1.010–1.025)
Urobilinogen, UA: 0.2 E.U./dL
pH, UA: 6 (ref 5.0–8.0)

## 2020-04-07 MED ORDER — HYDROCHLOROTHIAZIDE 25 MG PO TABS: 25.0000 mg | ORAL_TABLET | Freq: Every day | ORAL | 3 refills | Status: DC

## 2020-04-07 NOTE — Patient Instructions (Addendum)
Buy a humidifier for your home  Health Maintenance, Female Adopting a healthy lifestyle and getting preventive care are important in promoting health and wellness. Ask your health care provider about:  The right schedule for you to have regular tests and exams.  Things you can do on your own to prevent diseases and keep yourself healthy. What should I know about diet, weight, and exercise? Eat a healthy diet   Eat a diet that includes plenty of vegetables, fruits, low-fat dairy products, and lean protein.  Do not eat a lot of foods that are high in solid fats, added sugars, or sodium. Maintain a healthy weight Body mass index (BMI) is used to identify weight problems. It estimates body fat based on height and weight. Your health care provider can help determine your BMI and help you achieve or maintain a healthy weight. Get regular exercise Get regular exercise. This is one of the most important things you can do for your health. Most adults should:  Exercise for at least 150 minutes each week. The exercise should increase your heart rate and make you sweat (moderate-intensity exercise).  Do strengthening exercises at least twice a week. This is in addition to the moderate-intensity exercise.  Spend less time sitting. Even light physical activity can be beneficial. Watch cholesterol and blood lipids Have your blood tested for lipids and cholesterol at 48 years of age, then have this test every 5 years. Have your cholesterol levels checked more often if:  Your lipid or cholesterol levels are high.  You are older than 48 years of age.  You are at high risk for heart disease. What should I know about cancer screening? Depending on your health history and family history, you may need to have cancer screening at various ages. This may include screening for:  Breast cancer.  Cervical cancer.  Colorectal cancer.  Skin cancer.  Lung cancer. What should I know about heart  disease, diabetes, and high blood pressure? Blood pressure and heart disease  High blood pressure causes heart disease and increases the risk of stroke. This is more likely to develop in people who have high blood pressure readings, are of African descent, or are overweight.  Have your blood pressure checked: ? Every 3-5 years if you are 66-75 years of age. ? Every year if you are 33 years old or older. Diabetes Have regular diabetes screenings. This checks your fasting blood sugar level. Have the screening done:  Once every three years after age 104 if you are at a normal weight and have a low risk for diabetes.  More often and at a younger age if you are overweight or have a high risk for diabetes. What should I know about preventing infection? Hepatitis B If you have a higher risk for hepatitis B, you should be screened for this virus. Talk with your health care provider to find out if you are at risk for hepatitis B infection. Hepatitis C Testing is recommended for:  Everyone born from [birth year] through 1964.  Anyone with known risk factors for hepatitis C. Sexually transmitted infections (STIs)  Get screened for STIs, including gonorrhea and chlamydia, if: ? You are sexually active and are younger than 48 years of age. ? You are older than 48 years of age and your health care provider tells you that you are at risk for this type of infection. ? Your sexual activity has changed since you were last screened, and you are at increased risk for chlamydia or  gonorrhea. Ask your health care provider if you are at risk.  Ask your health care provider about whether you are at high risk for HIV. Your health care provider may recommend a prescription medicine to help prevent HIV infection. If you choose to take medicine to prevent HIV, you should first get tested for HIV. You should then be tested every 3 months for as long as you are taking the medicine. Pregnancy  If you are about to stop  having your period (premenopausal) and you may become pregnant, seek counseling before you get pregnant.  Take 400 to 800 micrograms (mcg) of folic acid every day if you become pregnant.  Ask for birth control (contraception) if you want to prevent pregnancy. Osteoporosis and menopause Osteoporosis is a disease in which the bones lose minerals and strength with aging. This can result in bone fractures. If you are 59 years old or older, or if you are at risk for osteoporosis and fractures, ask your health care provider if you should:  Be screened for bone loss.  Take a calcium or vitamin D supplement to lower your risk of fractures.  Be given hormone replacement therapy (HRT) to treat symptoms of menopause. Follow these instructions at home: Lifestyle  Do not use any products that contain nicotine or tobacco, such as cigarettes, e-cigarettes, and chewing tobacco. If you need help quitting, ask your health care provider.  Do not use street drugs.  Do not share needles.  Ask your health care provider for help if you need support or information about quitting drugs. Alcohol use  Do not drink alcohol if: ? Your health care provider tells you not to drink. ? You are pregnant, may be pregnant, or are planning to become pregnant.  If you drink alcohol: ? Limit how much you use to 0-1 drink a day. ? Limit intake if you are breastfeeding.  Be aware of how much alcohol is in your drink. In the [place] one drink equals one 12 oz bottle of beer (355 mL), one 5 oz glass of wine (148 mL), or one 1 oz glass of hard liquor (44 mL). General instructions  Schedule regular health, dental, and eye exams.  Stay current with your vaccines.  Tell your health care provider if: ? You often feel depressed. ? You have ever been abused or do not feel safe at home. Summary  Adopting a healthy lifestyle and getting preventive care are important in promoting health and wellness.  Follow your health  care provider's instructions about healthy diet, exercising, and getting tested or screened for diseases.  Follow your health care provider's instructions on monitoring your cholesterol and blood pressure. This information is not intended to replace advice given to you by your health care provider. Make sure you discuss any questions you have with your health care provider. Document Revised: 04/04/2018 Document Reviewed: 04/04/2018 Elsevier Patient Education  2020 [employer].

## 2020-04-07 NOTE — Progress Notes (Signed)
12/14/202110:48 AM  Maria [name] 08-13-1970, 49 y.o., female [id number]  Chief Complaint  Patient presents with  . Annual Exam    Frequent urination at night     HPI:   Patient is a 49 y.o. female with past medical history significant for headaches who presents today for annual physical.  Date of last exam: 03/21/18  Interpreter used for this visit? Yes: [place]  Patient Care Team: Christan Defranco, Laurita Quint, FNP as PCP - General (Family Medicine)  Works at [employer] Divorced with 2 kids (29 and 81)  Last CPE Jan 2019 Cervical Cancer Screening: nilm, HPV neg April 2018, due this year Breast Cancer Screening: 2016, ordering mammogram today Colorectal Cancer Screening: at age 54: ordering today Bone Density Testing: at age 32 HIV Screening: 2018 Hep C screen: 04/07/2020 STI Screening: 2018 Seasonal Influenza Vaccination: declines Covid vaccination: declines Td/Tdap Vaccination: 2014 Pneumococcal Vaccination: at age 13 Zoster Vaccination: at age 39 Frequency of Dental evaluation: Q6 months Frequency of Eye evaluation: yearly Diet/Exercise: Regularly BMI 20 Uses condoms LMP: 11/29 Regular  HTN  BP Readings from Last 3 Encounters:  04/07/20 (!) 150/80  03/21/18 138/82  12/20/17 127/83    Wt Readings from Last 3 Encounters:  04/07/20 112 lb (50.8 kg)  03/21/18 115 lb (52.2 kg)  12/20/17 116 lb (52.6 kg)     HISTORY: Patient Active Problem List   Diagnosis Date Noted  . Headache disorder 03/31/2017  . Preseptal cellulitis of left lower eyelid 12/12/2016  . Uterine leiomyoma 08/03/2016  . Nocturia 05/23/2016  . Urinary frequency 05/16/2016  . Right thyroid nodule 03/28/2016    Immunization History  Administered Date(s) Administered  . PFIZER SARS-COV-2 Vaccination 07/17/2019, 08/07/2019  . Tdap 08/27/2012    Health Maintenance  Topic Date Due  . PAP SMEAR-Modifier  08/04/2019  . INFLUENZA VACCINE  07/23/2020 (Originally 11/24/2019)  . TETANUS/TDAP   08/28/2022  . COVID-19 Vaccine  Completed  . Hepatitis C Screening  Completed  . HIV Screening  Completed    Depression screen [facility] 2/9 04/07/2020 12/20/2017 12/13/2017  Decreased Interest 0 - 0  Down, Depressed, Hopeless 0 0 0  PHQ - 2 Score 0 0 0  Altered sleeping - - -  Tired, decreased energy - - -  Change in appetite - - -  Feeling bad or failure about yourself  - - -  Trouble concentrating - - -  Moving slowly or fidgety/restless - - -  Suicidal thoughts - - -  PHQ-9 Score - - -  Difficult doing work/chores - - -    Fall Risk  04/07/2020 12/20/2017 12/13/2017 11/10/2017 09/12/2017  Falls in the past year? 0 No No No No  Number falls in past yr: 0 - - - -  Injury with Fall? 0 - - - -  Follow up Falls evaluation completed - - - -     No Known Allergies  Prior to Admission medications   Medication Sig Start Date End Date Taking? Authorizing Provider  triamcinolone (NASACORT) 55 MCG/ACT AERO nasal inhaler Place 2 sprays into the nose daily. 03/21/18   Daleen Squibb, MD    History reviewed. No pertinent past medical history.  Past Surgical History:  Procedure Laterality Date  . RHINOPLASTY      Social History   Tobacco Use  . Smoking status: Never Smoker  . Smokeless tobacco: Never Used  Substance Use Topics  . Alcohol use: Yes    Comment: occasionally    Family  History  Problem Relation Age of Onset  . Hypertension Father      Review of Systems  Constitutional: Negative for chills, fever and malaise/fatigue.  Eyes: Negative for blurred vision and double vision.  Respiratory: Positive for cough. Negative for shortness of breath and wheezing.   Cardiovascular: Negative for chest pain, palpitations and leg swelling.  Gastrointestinal: Negative for abdominal pain, blood in stool, constipation, diarrhea, heartburn, nausea and vomiting.  Genitourinary: Negative for dysuria, frequency and hematuria.  Musculoskeletal: Positive for back pain. Negative for  joint pain.  Skin: Negative for rash.  Neurological: Positive for headaches. Negative for dizziness and weakness.     OBJECTIVE:  Today's Vitals   04/07/20 1014  BP: (!) 150/80  Pulse: 84  Temp: 98.4 F (36.9 C)  SpO2: 100%  Weight: 112 lb (50.8 kg)  Height: $Remove'5\' 2"'mGrAmED$  (1.575 m)   Body mass index is 20.49 kg/m.   Physical Exam Vitals reviewed.  Constitutional:      Appearance: Normal appearance.  HENT:     Right Ear: Tympanic membrane and ear canal normal.     Left Ear: Tympanic membrane and ear canal normal.     Nose: Nose normal.  Eyes:     Extraocular Movements: Extraocular movements intact.     Pupils: Pupils are equal, round, and reactive to light.  Neck:     Thyroid: No thyroid mass, thyromegaly or thyroid tenderness.  Cardiovascular:     Rate and Rhythm: Normal rate and regular rhythm.     Pulses: Normal pulses.     Heart sounds: Normal heart sounds.  Pulmonary:     Effort: Pulmonary effort is normal.     Breath sounds: Normal breath sounds.  Abdominal:     General: Bowel sounds are normal.     Palpations: Abdomen is soft.  Musculoskeletal:        General: Normal range of motion.     Cervical back: Normal range of motion.  Skin:    General: Skin is warm and dry.     Capillary Refill: Capillary refill takes less than 2 seconds.  Neurological:     General: No focal deficit present.     Mental Status: She is alert and oriented to person, place, and time.  Psychiatric:        Mood and Affect: Mood normal.        Behavior: Behavior normal.     No results found for this or any previous visit (from the past 24 hour(s)).  No results found.   ASSESSMENT and PLAN  Problem List Items Addressed This Visit      Other   Urinary frequency   Relevant Orders   POCT urinalysis dipstick    Other Visit Diagnoses    Encounter for annual physical exam    -  Primary   Relevant Orders   CMP14+EGFR   TSH   Lipid panel   CBC   Vitamin D, 25-hydroxy    Hemoglobin A1c   Encounter for screening mammogram for malignant neoplasm of breast       Relevant Orders   MS DIGITAL SCREENING TOMO BILATERAL   Encounter for hepatitis C screening test for low risk patient       Relevant Orders   Hepatitis C antibody   Benign essential HTN       Relevant Medications   hydrochlorothiazide (HYDRODIURIL) 25 MG tablet      Will follow up with lab results Needs to schedule mammogram  Will recheck  BP and do pap next visit. Return in about 4 weeks (around 05/05/2020) for Pap and BP recheck.    Huston Foley Man Bonneau, FNP-BC [facility] [place], [place] [address] Ph.  [phone] Fax [phone]

## 2020-04-08 LAB — CMP14+EGFR
ALT: 24 IU/L (ref 0–32)
AST: 24 IU/L (ref 0–40)
Albumin/Globulin Ratio: 1.4 (ref 1.2–2.2)
Albumin: 4.2 g/dL (ref 3.8–4.8)
Alkaline Phosphatase: 62 IU/L (ref 44–121)
BUN/Creatinine Ratio: 23 (ref 9–23)
BUN: 15 mg/dL (ref 6–24)
Bilirubin Total: 0.6 mg/dL (ref 0.0–1.2)
CO2: 20 mmol/L (ref 20–29)
Calcium: 9 mg/dL (ref 8.7–10.2)
Chloride: 104 mmol/L (ref 96–106)
Creatinine, Ser: 0.64 mg/dL (ref 0.57–1.00)
GFR calc Af Amer: 122 mL/min/{1.73_m2} (ref >59)
GFR calc non Af Amer: 106 mL/min/{1.73_m2} (ref >59)
Globulin, Total: 3 g/dL (ref 1.5–4.5)
Glucose: 86 mg/dL (ref 65–99)
Potassium: 3.7 mmol/L (ref 3.5–5.2)
Sodium: 139 mmol/L (ref 134–144)
Total Protein: 7.2 g/dL (ref 6.0–8.5)

## 2020-04-08 LAB — CBC
Hematocrit: 38.4 % (ref 34.0–46.6)
Hemoglobin: 12.7 g/dL (ref 11.1–15.9)
MCH: 32.6 pg (ref 26.6–33.0)
MCHC: 33.1 g/dL (ref 31.5–35.7)
MCV: 99 fL — ABNORMAL HIGH (ref 79–97)
Platelets: 239 10*3/uL (ref 150–450)
RBC: 3.9 x10E6/uL (ref 3.77–5.28)
RDW: 11.8 % (ref 11.7–15.4)
WBC: 6.4 10*3/uL (ref 3.4–10.8)

## 2020-04-08 LAB — LIPID PANEL
Chol/HDL Ratio: 2.5 ratio (ref 0.0–4.4)
Cholesterol, Total: 198 mg/dL (ref 100–199)
HDL: 80 mg/dL (ref >39)
LDL Chol Calc (NIH): 104 mg/dL — ABNORMAL HIGH (ref 0–99)
Triglycerides: 77 mg/dL (ref 0–149)
VLDL Cholesterol Cal: 14 mg/dL (ref 5–40)

## 2020-04-08 LAB — HEMOGLOBIN A1C
Est. average glucose Bld gHb Est-mCnc: 105 mg/dL
Hgb A1c MFr Bld: 5.3 % (ref 4.8–5.6)

## 2020-04-08 LAB — VITAMIN D 25 HYDROXY (VIT D DEFICIENCY, FRACTURES): Vit D, 25-Hydroxy: 20.4 ng/mL — ABNORMAL LOW (ref 30.0–100.0)

## 2020-04-08 LAB — HEPATITIS C ANTIBODY: Hep C Virus Ab: 0.1 s/co ratio (ref 0.0–0.9)

## 2020-04-08 LAB — TSH: TSH: 1.84 u[IU]/mL (ref 0.450–4.500)

## 2020-04-08 NOTE — Progress Notes (Signed)
If you could let Maria [name] know overall her labs look good. Her vitamin D is low. For this I would recommend an over the counter supplement of 2000 IU of vitamin D3 to be taken daily with food. Her cholesterol is also slightly elevated. No medications are needed at this time, but continue to watch diet and exercise.

## 2020-05-05 ENCOUNTER — Other Ambulatory Visit (HOSPITAL_COMMUNITY): Admission: RE | Admit: 2020-05-05 | Payer: BLUE CROSS/BLUE SHIELD | Source: Ambulatory Visit

## 2020-05-05 ENCOUNTER — Encounter: Payer: Self-pay | Admitting: Family Medicine

## 2020-05-05 ENCOUNTER — Ambulatory Visit (INDEPENDENT_AMBULATORY_CARE_PROVIDER_SITE_OTHER): Payer: 59 | Admitting: Family Medicine

## 2020-05-05 ENCOUNTER — Other Ambulatory Visit: Payer: Self-pay

## 2020-05-05 VITALS — BP 135/92 | HR 96 | Temp 98.0°F | Ht 62.0 in | Wt 109.0 lb

## 2020-05-05 DIAGNOSIS — Z124 Encounter for screening for malignant neoplasm of cervix: Secondary | ICD-10-CM

## 2020-05-05 DIAGNOSIS — I10 Essential (primary) hypertension: Secondary | ICD-10-CM

## 2020-05-05 DIAGNOSIS — E559 Vitamin D deficiency, unspecified: Secondary | ICD-10-CM

## 2020-05-05 DIAGNOSIS — E7800 Pure hypercholesterolemia, unspecified: Secondary | ICD-10-CM

## 2020-05-05 DIAGNOSIS — R339 Retention of urine, unspecified: Secondary | ICD-10-CM | POA: Diagnosis not present

## 2020-05-05 DIAGNOSIS — N951 Menopausal and female climacteric states: Secondary | ICD-10-CM

## 2020-05-05 LAB — POCT URINALYSIS DIP (MANUAL ENTRY)
Bilirubin, UA: NEGATIVE
Blood, UA: NEGATIVE
Glucose, UA: NEGATIVE mg/dL
Ketones, POC UA: NEGATIVE mg/dL
Leukocytes, UA: NEGATIVE
Nitrite, UA: NEGATIVE
Protein Ur, POC: NEGATIVE mg/dL
Spec Grav, UA: >=1.03 — AB (ref 1.010–1.025)
Urobilinogen, UA: 0.2 E.U./dL
pH, UA: 6 (ref 5.0–8.0)

## 2020-05-05 MED ORDER — VALSARTAN 80 MG PO TABS: 80.0000 mg | ORAL_TABLET | Freq: Every day | ORAL | 3 refills | Status: DC

## 2020-05-05 NOTE — Patient Instructions (Addendum)
If you have lab work done today you will be contacted with your lab results within the next 2 weeks.  If you have not heard from [place] then please contact us. The fastest way to get your results is to register for My Chart.   IF you received an x-ray today, you will receive an invoice from [facility]. Please contact [facility] at [phone] with questions or concerns regarding your invoice.   IF you received labwork today, you will receive an invoice from [place]. Please contact [facility] at [phone] with questions or concerns regarding your invoice.   Our billing staff will not be able to assist you with questions regarding bills from these companies.  You will be contacted with the lab results as soon as they are available. The fastest way to get your results is to activate your My Chart account. Instructions are located on the last page of this paperwork. If you have not heard from [place] regarding the results in 2 weeks, please contact this office.      Xt nghi?m Pap T?i sao ti c bi ki?m tra ny? Xt nghi?m Pap, cn ???c g?i l Pap smear, l m?t xt nghi?m sng l?c ?? ki?m tra cc d?u hi?u c?a: Ung th? m ??o, c? t? cung v t? cung. C? t? cung l ph?n d??i c?a t? cung m? vo m ??o. S? nhi?m trng. Nh?ng thay ??i c th? l d?u hi?u cho th?y ung th? ?ang pht tri?n (nh?ng thay ??i ti?n ung th?). Ph? n? c?n ki?m tra ny m?t cch th??ng xuyn. Ni chung, b?n nn lm xt nghi?m Pap 3 n?m m?t l?n cho ??n khi b?n mn kinh ho?c 65 tu?i. Ph? n? t? 30-60 tu?i c th? ch?n lm xt nghi?m Pap c?a h? cng lc v?i xt nghi?m HPV (virus gy u nh ? ng??i) 5 n?m m?t l?n (thay vo ? 3 n?m m?t l?n). Nh cung c?p d?ch v? ch?m [place] s?c kh?e c?a b?n c th? khuyn b?n nn lm xt nghi?m Pap th??ng xuyn h?n ho?c t h?n ty thu?c vo tnh tr?ng s?c kh?e c?a b?n v k?t qu? xt nghi?m Pap trong qu kh?Marland Kitchen Lo?i m?u no ???c l?y? Nh cung c?p d?ch v? ch?m [place] s?c kh?e c?a b?n s? thu th?p  m?t m?u t? bo t? b? m?t c? t? cung c?a b?n. ?i?u ny s? ???c th?c hi?n b?ng cch s? d?ng t?m bng nh?, tha nh?a ho?c bn ch?i. M?u ny th??ng ???c thu th?p trong qu trnh khm ph? khoa, khi b?n ?ang n?m ng?a trn bn khm v?i chn k chn (ki?ng ba chn). Trong m?t s? tr??ng h?p, ch?t l?ng (d?ch ti?t) t? c? t? cung ho?c m ??o c?ng c th? ???c thu th?p. Ti chu?n b? cho bi ki?m tra ny nh? th? no? Nh?n bi?t v? tr c?a b?n trong chu k? kinh nguy?t. N?u b?n c kinh nguy?t vo ngy th? nghi?m, b?n c th? ???c yu c?u ??i l?ch. B?n c th? ph?i ln l?ch l?i n?u ? bi?t mnh b? nhi?m trng m ??o vo ngy lm xt nghi?m. Lm theo h??ng d?n t? nh cung c?p d?ch v? ch?m [place] s?c kh?e c?a b?n v?: Thay ??i ho?c ng?ng cc lo?i thu?c thng th??ng c?a b?n. M?t s? lo?i thu?c c th? gy ra k?t qu? xt nghi?m b?t th??ng, ch?ng h?n nh? digitalis v tetracycline. Trnh th?t r?a ho?c t?m vo ngy tr??c ho?c ngy lm xt nghi?m. Ni v?i nh cung c?p  d?ch v? ch?m [place] s?c kh?e v?: B?t k? d? ?ng no b?n c. T?t c? cc lo?i thu?c b?n ?ang dng, bao g?m vitamin, th?o m?c, thu?c nh? m?t, kem v thu?c khng k ??n. B?t k? r?i lo?n mu no b?n c. B?t k? cu?c ph?u thu?t no b?n ? tr?i qua. B?t k? ?i?u ki?n y t? no b?n c. Cho d b?n ?ang mang thai ho?c c th? mang [place]. K?t qu? ???c bo co nh? th? no? K?t qu? xt nghi?m c?a b?n s? ???c bo co l b?t th??ng ho?c bnh th??ng. K?t qu? d??ng tnh gi? c th? x?y ra. M?t d??ng tnh gi? l khng chnh xc v n c ngh?a l m?t ?i?u ki?n c khi n khng c. K?t qu? m tnh gi? c th? x?y ra. M?t m sai l khng chnh xc b?i v n c ngh?a l m?t ?i?u ki?n khng c khi n x?y ra. nh?ng k?t qu? ny c ngh?a l g? K?t qu? xt nghi?m bnh th??ng c ngh?a l b?n khng c d?u hi?u c?a ung th? m ??o, c? t? cung ho?c t? cung. M?t k?t qu? b?t th??ng c th? c ngh?a l b?n c: B?nh ung th?. Xt nghi?m Pap khng ?? ?? ch?n ?on ung th?. B?n s? ph?i th?c hi?n nhi?u bi ki?m tra  h?n trong tr??ng h?p ny. Nh?ng thay ??i ti?n ung th? trong m ??o, c? t? cung ho?c t? cung c?a b?n. Vim l? tuy?n c? t? cung. M?t b?nh STD (b?nh ly truy?n qua ???ng tnh d?c). Nhi?m trng n?m. Nhi?m k sinh trng. Ni chuy?n v?i nh cung c?p d?ch v? ch?m [place] s?c kh?e c?a b?n v?  ngh?a c?a k?t qu?Marland Kitchen Cc cu h?i ?? h?i nh cung c?p d?ch v? ch?m [place] s?c kh?e c?a b?n Hy h?i nh cung c?p d?ch v? ch?m [place] s?c kh?e c?a b?n ho?c b? ph?n ?ang th?c hi?n xt nghi?m: Khi no k?t qu? c?a ti s? s?n sng? Ti s? nh?n ???c k?t qu? c?a mnh nh? th? no? l?a ch?n ?i?u tr? c?a ti l g? Ti c?n nh?ng xt nghi?m no khc? b??c ti?p theo c?a ti l g? B?n tm t?t Ni chung, ph? n? nn lm xt nghi?m Pap 3 n?m m?t l?n cho ??n khi h? mn kinh ho?c 65 tu?i. Nh cung c?p d?ch v? ch?m [place] s?c kh?e c?a b?n s? thu th?p m?t m?u t? bo t? b? m?t c? t? cung c?a b?n. ?i?u ny s? ???c th?c hi?n b?ng cch s? d?ng t?m bng nh?, tha nh?a ho?c bn ch?i. Trong m?t s? tr??ng h?p, ch?t l?ng (d?ch ti?t) t? c? t? cung ho?c m ??o c?ng c th? ???c thu th?p. Thng tin ny khng nh?m thay th? l?i khuyn c?a nh cung c?p d?ch v? ch?m [place] s?c kh?e c?a b?n. ??m b?o r?ng b?n th?o lu?n b?t k? cu h?i no b?n c v?i nh cung c?p d?ch v? ch?m  s?c kh?e c?a mnh.

## 2020-05-05 NOTE — Progress Notes (Signed)
1/11/202210:52 AM  Maria [name] [birth year], 50 y.o., female [id number]  Chief Complaint  Patient presents with  . Hypertension    Follow up - takes hctz  . Gynecologic Exam    HPI:   Patient is a 50 y.o. female with past medical history significant for headaches who presents today for pap.  Cervical Cancer Screening:nilm, HPV neg April 2018, due this year. Still having a regular periods (monthly) Starting to experience hot flashes   HTN HCTZ 25mg  daily  Started last visit Denies side effects Goal BP < 130/80  BP Readings from Last 3 Encounters:  05/05/20 (!) 135/92  04/07/20 (!) 150/80  03/21/18 138/82   Lab Results  Component Value Date   CHOL 198 04/07/2020   HDL 80 04/07/2020   LDLCALC 104 (H) 04/07/2020   LDLDIRECT 82 10/07/2013   TRIG 77 04/07/2020   CHOLHDL 2.5 04/07/2020   The 10-year ASCVD risk score Mikey Bussing DC Jr., et al., 2013) is: 0.9%   Values used to calculate the score:     Age: 45 years     Sex: Female     Is Non-Hispanic African American: No     Diabetic: No     Tobacco smoker: No     Systolic Blood Pressure: 299 mmHg     Is BP treated: Yes     HDL Cholesterol: 80 mg/dL     Total Cholesterol: 198 mg/dL  Last vitamin D Lab Results  Component Value Date   VD25OH 20.4 (L) 04/07/2020     Depression screen PHQ 2/9 05/05/2020 04/07/2020 12/20/2017  Decreased Interest 0 0 -  Down, Depressed, Hopeless 0 0 0  PHQ - 2 Score 0 0 0  Altered sleeping - - -  Tired, decreased energy - - -  Change in appetite - - -  Feeling bad or failure about yourself  - - -  Trouble concentrating - - -  Moving slowly or fidgety/restless - - -  Suicidal thoughts - - -  PHQ-9 Score - - -  Difficult doing work/chores - - -    Fall Risk  05/05/2020 04/07/2020 12/20/2017 12/13/2017 11/10/2017  Falls in the past year? 0 0 No No No  Number falls in past yr: 0 0 - - -  Injury with Fall? 0 0 - - -  Follow up Falls evaluation completed Falls evaluation completed - - -      No Known Allergies  Prior to Admission medications   Medication Sig Start Date End Date Taking? Authorizing Provider  hydrochlorothiazide (HYDRODIURIL) 25 MG tablet Take 1 tablet (25 mg total) by mouth daily. 04/07/20   Billye Pickerel, Laurita Quint, FNP  triamcinolone (NASACORT) 55 MCG/ACT AERO nasal inhaler Place 2 sprays into the nose daily. Patient not taking: Reported on 04/07/2020 03/21/18   Jacelyn Pi, Lilia Argue, MD    History reviewed. No pertinent past medical history.  Past Surgical History:  Procedure Laterality Date  . RHINOPLASTY      Social History   Tobacco Use  . Smoking status: Never Smoker  . Smokeless tobacco: Never Used  Substance Use Topics  . Alcohol use: Yes    Comment: occasionally    Family History  Problem Relation Age of Onset  . Hypertension Father     Review of Systems  Constitutional: Negative for chills, fever and malaise/fatigue.  Eyes: Negative for blurred vision and double vision.  Respiratory: Negative for cough, shortness of breath and wheezing.   Cardiovascular: Negative for chest pain,  palpitations and leg swelling.  Gastrointestinal: Negative for abdominal pain, blood in stool, constipation, diarrhea, heartburn, nausea and vomiting.  Genitourinary: Negative for dysuria, frequency, hematuria and urgency.       Incomplete emptying, retention  Musculoskeletal: Negative for back pain and joint pain.  Skin: Negative for rash.  Neurological: Negative for dizziness, weakness and headaches.     OBJECTIVE:  Today's Vitals   05/05/20 0852  BP: (!) 135/92  Pulse: 96  Temp: 98 F (36.7 C)  SpO2: 98%  Weight: 109 lb (49.4 kg)  Height: 5\' 2"  (1.575 m)   Body mass index is 19.94 kg/m.   Physical Exam Constitutional:      General: She is not in acute distress.    Appearance: Normal appearance. She is not ill-appearing.  HENT:     Head: Normocephalic.  Cardiovascular:     Rate and Rhythm: Normal rate and regular rhythm.     Pulses:  Normal pulses.     Heart sounds: Normal heart sounds. No murmur heard. No friction rub. No gallop.   Pulmonary:     Effort: Pulmonary effort is normal. No respiratory distress.     Breath sounds: Normal breath sounds. No stridor. No wheezing, rhonchi or rales.  Abdominal:     General: Bowel sounds are normal.     Palpations: Abdomen is soft.     Tenderness: There is no abdominal tenderness.  Musculoskeletal:     Right lower leg: No edema.     Left lower leg: No edema.  Skin:    General: Skin is warm and dry.  Neurological:     Mental Status: She is alert and oriented to person, place, and time.  Psychiatric:        Mood and Affect: Mood normal.        Behavior: Behavior normal.   Pelvic exam: normal external genitalia, vulva, vagina, cervix, uterus and adnexa, VULVA: normal appearing vulva with no masses, tenderness or lesions, VAGINA: normal appearing vagina with normal color and discharge, no lesions, CERVIX: normal appearing cervix without discharge or lesions, UTERUS: uterus is normal size, shape, consistency and nontender, ADNEXA: normal adnexa in size, nontender and no masses, PAP: Pap smear done today, thin-prep method, DNA probe for chlamydia and GC obtained, HPV test, [place] M[place] done - results: DNA probe for chlamydia and GC obtained, exam chaperoned by [facility], LPN.   Results for orders placed or performed in visit on 05/05/20 (from the past 24 hour(s))  POCT urinalysis dipstick     Status: Abnormal   Collection Time: 05/05/20  9:46 AM  Result Value Ref Range   Color, UA yellow yellow   Clarity, UA clear clear   Glucose, UA negative negative mg/dL   Bilirubin, UA negative negative   Ketones, POC UA negative negative mg/dL   Spec Grav, UA >=1.030 (A) 1.010 - 1.025   Blood, UA negative negative   pH, UA 6.0 5.0 - 8.0   Protein Ur, POC negative negative mg/dL   Urobilinogen, UA 0.2 0.2 or 1.0 E.U./dL   Nitrite, UA Negative Negative   Leukocytes, UA Negative Negative     No results found.   ASSESSMENT and PLAN  Problem List Items Addressed This Visit      Cardiovascular and Mediastinum   Benign essential HTN - Primary   Relevant Medications   valsartan (DIOVAN) 80 MG tablet     Other   Vitamin D deficiency   Pure hypercholesterolemia   Relevant Medications   valsartan (DIOVAN)  80 MG tablet    Other Visit Diagnoses    Encounter for Pap smear of cervix with HPV DNA cotesting       Relevant Orders   Cytology - PAP([place])   Urinary retention       Relevant Orders   POCT urinalysis dipstick (Completed)   Hot flashes due to menopause       Relevant Medications   valsartan (DIOVAN) 80 MG tablet    Plan  Continue HCTZ, adding valsartan 80 mg daily  Will follow up with lab results   Return in about 3 months (around 08/03/2020).    Huston Foley Sharell Hilmer, FNP-BC Primary Care at [facility] [place], [place] [address] Ph.  [phone] Fax [phone]

## 2020-05-11 LAB — CYTOLOGY - PAP
Chlamydia: NEGATIVE
Comment: NEGATIVE
Comment: NEGATIVE
Comment: NEGATIVE
Comment: NEGATIVE
Comment: NORMAL
Diagnosis: NEGATIVE
HSV1: NEGATIVE
HSV2: NEGATIVE
High risk HPV: NEGATIVE
Neisseria Gonorrhea: NEGATIVE
Trichomonas: NEGATIVE

## 2020-05-11 NOTE — Progress Notes (Signed)
If you could let her know her pap came back normal, STI test negative and HPV negative. No screening needed for 5 years.

## 2020-08-04 ENCOUNTER — Ambulatory Visit: Payer: 59 | Admitting: Family Medicine

## 2020-08-20 ENCOUNTER — Ambulatory Visit
Admission: RE | Admit: 2020-08-20 | Discharge: 2020-08-20 | Disposition: A | Discharge status: Home / Self Care | Payer: 59 | Source: Ambulatory Visit | Attending: Nurse Practitioner | Admitting: Nurse Practitioner

## 2020-08-20 ENCOUNTER — Other Ambulatory Visit: Payer: Self-pay

## 2020-08-20 ENCOUNTER — Ambulatory Visit (INDEPENDENT_AMBULATORY_CARE_PROVIDER_SITE_OTHER): Payer: 59

## 2020-08-20 VITALS — BP 163/98 | HR 93 | Temp 99.0°F | Resp 18

## 2020-08-20 DIAGNOSIS — R0989 Other specified symptoms and signs involving the circulatory and respiratory systems: Secondary | ICD-10-CM

## 2020-08-20 DIAGNOSIS — Z20822 Contact with and (suspected) exposure to covid-19: Secondary | ICD-10-CM | POA: Diagnosis not present

## 2020-08-20 DIAGNOSIS — R042 Hemoptysis: Secondary | ICD-10-CM

## 2020-08-20 HISTORY — DX: Essential (primary) hypertension: I10

## 2020-08-20 HISTORY — DX: Type 2 diabetes mellitus without complications: E11.9

## 2020-08-20 MED ORDER — BENZONATATE 100 MG PO CAPS: 100.0000 mg | ORAL_CAPSULE | Freq: Three times a day (TID) | ORAL | 0 refills | Status: DC

## 2020-08-20 MED ORDER — ALBUTEROL SULFATE HFA 108 (90 BASE) MCG/ACT IN AERS
2.0000 | INHALATION_SPRAY | Freq: Once | RESPIRATORY_TRACT | Status: AC
  Administered 2020-08-20: 2 via RESPIRATORY_TRACT

## 2020-08-20 MED ORDER — PREDNISONE 10 MG (21) PO TBPK: ORAL_TABLET | Freq: Every day | ORAL | 0 refills | Status: DC

## 2020-08-20 MED ORDER — AZITHROMYCIN 250 MG PO TABS: ORAL_TABLET | ORAL | 0 refills | Status: DC

## 2020-08-20 NOTE — ED Provider Notes (Signed)
[facility]    CSN: [id number] Arrival date & time: 08/20/20  0845      History   Chief Complaint Chief Complaint  Patient presents with  . appt 9am- cough up blood    HPI Maria [name] is a 50 y.o. female.   Subjective:   Maria [name] is a 50 y.o. female here for evaluation of a cough.  The cough has been present for the past 4 months but has been worse over the past couple of weeks.  Patient reports frank hemoptysis as well as a normal productive cough. There has been some chest pain with cough but no significant shortness of breath.  The cough does not seem to be aggravated by anything particular.  Patient denies any change in voice, chills, fever, heartburn, night sweats, weight loss, nausea, vomiting, diarrhea, melena or hematochezia.  Patient does not have a history of asthma or tuberculosis. Patient has not had recent travel. Patient does not have a history of smoking.  Patient works as a Scientist, forensic. The patient has not had a previous chest x-ray. Patient has not had a PPD done.  The patient has not tried anything for her cough.  The patient has not had any previous evaluation for her symptoms.  The patient denies any history of COVID-19.  The patient has been completely vaccinated against COVID-19.  The following portions of the patient's history were reviewed and updated as appropriate: allergies, current medications, past family history, past medical history, past social history, past surgical history and problem list.       Past Medical History:  Diagnosis Date  . Diabetes mellitus without complication ([place])   . Hypertension     Patient Active Problem List   Diagnosis Date Noted  . Benign essential HTN 05/05/2020  . Vitamin D deficiency 05/05/2020  . Pure hypercholesterolemia 05/05/2020  . Headache disorder 03/31/2017  . Preseptal cellulitis of left lower eyelid 12/12/2016  . Uterine leiomyoma 08/03/2016  . Nocturia 05/23/2016  . Urinary frequency  05/16/2016  . Right thyroid nodule 03/28/2016    Past Surgical History:  Procedure Laterality Date  . RHINOPLASTY      OB History    Gravida  2   Para  2   Term  2   Preterm      AB      Living        SAB      IAB      Ectopic      Multiple      Live Births               Home Medications    Prior to Admission medications   Medication Sig Start Date End Date Taking? Authorizing Provider  azithromycin (ZITHROMAX Z-PAK) 250 MG tablet Take 2 tablets by mouth once on day 1 followed by 1 tablet daily by mouth on days 2 through 5 08/20/20  Yes Numa Heatwole, [place], FNP  benzonatate (TESSALON) 100 MG capsule Take 1 capsule (100 mg total) by mouth every 8 (eight) hours. 08/20/20  Yes Alishah Schulte, Aldona Bar, FNP  predniSONE (STERAPRED UNI-PAK 21 TAB) 10 MG (21) TBPK tablet Take by mouth daily. Take 6 tabs by mouth daily  for 2 days, then 5 tabs for 2 days, then 4 tabs for 2 days, then 3 tabs for 2 days, 2 tabs for 2 days, then 1 tab by mouth daily for 2 days 08/20/20  Yes Orlin Hilding, Aldona Bar, FNP  valsartan (DIOVAN) 80 MG tablet  Take 1 tablet (80 mg total) by mouth daily. 05/05/20   Just, Laurita Quint, FNP    Family History Family History  Problem Relation Age of Onset  . Hypertension Father     Social History Social History   Tobacco Use  . Smoking status: Never Smoker  . Smokeless tobacco: Never Used  Substance Use Topics  . Alcohol use: Not Currently    Comment: occasionally  . Drug use: No     Allergies   Patient has no known allergies.   Review of Systems Review of Systems  Constitutional: Negative for fever.  HENT: Negative for congestion.   Respiratory: Positive for cough, chest tightness and shortness of breath.   Cardiovascular: Negative for leg swelling.  Gastrointestinal: Negative for anal bleeding, blood in stool, diarrhea, nausea and vomiting.  Genitourinary: Negative.   Neurological: Negative.   All other systems reviewed and are  negative.    Physical Exam Triage Vital Signs ED Triage Vitals  Enc Vitals Group     BP 08/20/20 0908 (!) 163/98     Pulse Rate 08/20/20 0908 93     Resp 08/20/20 0908 18     Temp 08/20/20 0908 99 F (37.2 C)     Temp Source 08/20/20 0908 Oral     SpO2 08/20/20 0908 99 %     Weight --      Height --      Head Circumference --      Peak Flow --      Pain Score 08/20/20 0911 0     Pain Loc --      Pain Edu? --      Excl. in [place]? --    No data found.  Updated Vital Signs BP (!) 163/98 (BP Location: Left Arm)   Pulse 93   Temp 99 F (37.2 C) (Oral)   Resp 18   LMP 08/07/2020   SpO2 99%   Visual Acuity Right Eye Distance:   Left Eye Distance:   Bilateral Distance:    Right Eye Near:   Left Eye Near:    Bilateral Near:     Physical Exam Vitals reviewed.  Constitutional:      General: She is not in acute distress.    Appearance: Normal appearance. She is not ill-appearing or toxic-appearing.  HENT:     Head: Normocephalic.     Mouth/Throat:     Mouth: Mucous membranes are moist.  Eyes:     Conjunctiva/sclera: Conjunctivae normal.  Cardiovascular:     Rate and Rhythm: Normal rate and regular rhythm.  Pulmonary:     Effort: Pulmonary effort is normal.     Breath sounds: Normal breath sounds.  Musculoskeletal:        General: Normal range of motion.     Cervical back: Normal range of motion and neck supple.  Lymphadenopathy:     Cervical: No cervical adenopathy.  Skin:    General: Skin is warm and dry.  Neurological:     General: No focal deficit present.     Mental Status: She is alert and oriented to person, place, and time.  Psychiatric:        Mood and Affect: Mood normal.      UC Treatments / Results  Labs (all labs ordered are listed, but only abnormal results are displayed) Labs Reviewed  NOVEL CORONAVIRUS, NAA    EKG   Radiology DG Chest 2 View  Result Date: 08/20/2020 CLINICAL DATA:  50 year old female coughing up  blood since  December. EXAM: CHEST - 2 VIEW COMPARISON:  Chest radiographs 12/03/2008.  Neck CT 09/13/2017. FINDINGS: Pulmonary hyperinflation since 2010. Mediastinal contours remain normal. Nipple shadows, more apparent on the left and similar to the 2010 radiographs. Coarse bilateral increased pulmonary interstitial markings only mildly progressed since 2010. No pneumothorax, pulmonary edema, pleural effusion or acute pulmonary opacity. No acute osseous abnormality identified. Negative visible bowel gas pattern. IMPRESSION: Development of pulmonary hyperinflation since 2010. No acute cardiopulmonary abnormality identified (nipple shadows). Electronically Signed   By: Genevie Ann M.D.   On: 08/20/2020 09:50    Procedures Procedures (including critical care time)  Medications Ordered in UC Medications  albuterol (VENTOLIN HFA) 108 (90 Base) MCG/ACT inhaler 2 puff (has no administration in time range)    Initial Impression / Assessment and Plan / UC Course  I have reviewed the triage vital signs and the nursing notes.  Pertinent labs & imaging results that were available during my care of the patient were reviewed by me and considered in my medical decision making (see chart for details).    50 year old [place] female presents with a 36-month history of hemoptysis that has been intermittent but worse over the past couple of weeks. No change in voice, chills, fever, heartburn, night sweats, weight loss, nausea, vomiting, diarrhea, melena or hematochezia.  No history of asthma, tuberculosis or COVID-19.  No current or past history of tobacco abuse.  Patient works as a Scientist, forensic.  Patient alert and oriented.  Low-grade temperature of 99 degrees.  Physical exam unremarkable.  Chest x-ray shows pulmonary hyperinflation but no other abnormalities.  COVID-19 test results pending.  Z-Pak, steroid taper, Tessalon Perles and albuterol inhaler prescribed.  Avoid exposure to tobacco smoke and fumes. Strongly advised  patient to follow-up with pulmonary.  Information for [facility] pulmonology provided. Patient should call to arrange follow-up.  Today's evaluation has revealed no signs of a dangerous process. Discussed diagnosis with patient and/or guardian. Patient and/or guardian aware of their diagnosis, possible red flag symptoms to watch out for and need for close follow up. Patient and/or guardian understands verbal and written discharge instructions. Patient and/or guardian comfortable with plan and disposition.  Patient and/or guardian has a clear mental status at this time, good insight into illness (after discussion and teaching) and has clear judgment to make decisions regarding their care  This care was provided during an unprecedented National Emergency due to the Novel Coronavirus (COVID-19) pandemic. COVID-19 infections and transmission risks place heavy strains on healthcare resources.  As this pandemic evolves, our facility, providers, and staff strive to respond fluidly, to remain operational, and to provide care relative to available resources and information. Outcomes are unpredictable and treatments are without well-defined guidelines. Further, the impact of COVID-19 on all aspects of urgent care, including the impact to patients seeking care for reasons other than COVID-19, is unavoidable during this national emergency. At this time of the global pandemic, management of patients has significantly changed, even for non-COVID positive patients given high local and regional COVID volumes at this time requiring high healthcare system and resource utilization. The standard of care for management of both COVID suspected and non-COVID suspected patients continues to change rapidly at the local, regional, national, and global levels. This patient was worked up and treated to the best available but ever changing evidence and resources available at this current time.   Documentation was completed with the aid of  voice recognition software. Transcription may contain typographical errors.  Final Clinical Impressions(s) / UC Diagnoses   Final diagnoses:  Encounter for screening laboratory testing for COVID-19 virus  Pulmonary hyperinflation  Hemoptysis     Discharge Instructions     Hemoptysis is when you cough up blood. Your chest x-ray shows hyperinflation but no infection. Hyperinflated lungs occur when air gets trapped in the lungs and causes them to overinflate. I will give you a prescription for antibiotics, steroids and an inhaler. It's important that you follow-up with a lung doctor for further evaluation.  Your COVID-19 test results are pending.  You will only be called if your test is positive.  Can go on to MyChart in the next day or so to review your results.  Go to the ED immediately if you start to experience the symptoms that we discussed today in the clinic.    ED Prescriptions    Medication Sig Dispense Auth. Provider   azithromycin (ZITHROMAX Z-PAK) 250 MG tablet Take 2 tablets by mouth once on day 1 followed by 1 tablet daily by mouth on days 2 through 5 6 tablet Kilo Eshelman, FNP   predniSONE (STERAPRED UNI-PAK 21 TAB) 10 MG (21) TBPK tablet Take by mouth daily. Take 6 tabs by mouth daily  for 2 days, then 5 tabs for 2 days, then 4 tabs for 2 days, then 3 tabs for 2 days, 2 tabs for 2 days, then 1 tab by mouth daily for 2 days 42 tablet Johannah Rozas, [place], FNP   benzonatate (TESSALON) 100 MG capsule Take 1 capsule (100 mg total) by mouth every 8 (eight) hours. 21 capsule Enrique Sack, FNP     PDMP not reviewed this encounter.   Enrique Sack, [place] Walk 08/20/20 1014

## 2020-08-20 NOTE — Discharge Instructions (Addendum)
Hemoptysis is when you cough up blood. Your chest x-ray shows hyperinflation but no infection. Hyperinflated lungs occur when air gets trapped in the lungs and causes them to overinflate. I will give you a prescription for antibiotics, steroids and an inhaler. It's important that you follow-up with a lung doctor for further evaluation.  Your COVID-19 test results are pending.  You will only be called if your test is positive.  Can go on to MyChart in the next day or so to review your results.  Go to the ED immediately if you start to experience the symptoms that we discussed today in the clinic.

## 2020-08-20 NOTE — ED Triage Notes (Signed)
Per daughter pt coughing up blood since 03/2020. States cough up BRB everyday x2wks. States told her PCP last year but it was never addressed.

## 2020-08-21 LAB — SARS-COV-2, NAA 2 DAY TAT

## 2020-08-21 LAB — NOVEL CORONAVIRUS, NAA: SARS-CoV-2, NAA: NOT DETECTED

## 2020-08-27 ENCOUNTER — Encounter: Payer: 59 | Admitting: Nurse Practitioner

## 2020-09-01 NOTE — Progress Notes (Signed)
She canceled appt This encounter was created in error - please disregard.

## 2020-09-18 ENCOUNTER — Encounter: Payer: Self-pay | Admitting: Pulmonary Disease

## 2020-09-18 ENCOUNTER — Other Ambulatory Visit: Payer: Self-pay

## 2020-09-18 ENCOUNTER — Ambulatory Visit (INDEPENDENT_AMBULATORY_CARE_PROVIDER_SITE_OTHER): Payer: 59 | Admitting: Pulmonary Disease

## 2020-09-18 VITALS — BP 142/88 | HR 92 | Ht 62.0 in | Wt 113.0 lb

## 2020-09-18 DIAGNOSIS — R042 Hemoptysis: Secondary | ICD-10-CM | POA: Diagnosis not present

## 2020-09-18 NOTE — Patient Instructions (Signed)
Hemoptysis completely resolved  Obtain CT scan of the chest to assess underlying lung structure  Tentatively, follow-up in 3 months  Encouraged to call with any significant concerns

## 2020-09-18 NOTE — Progress Notes (Signed)
Maria [name]    [id number]    [birth year]  Primary Care Physician:Pcp, No  Referring Physician: Enrique Sack, [place] [address] [place],  [place] [address]  Chief complaint:   Patient seen for hemoptysis  HPI:  She had had blood in the sputum for a few months Happened at different times  Sometimes with sputum and sometimes with just a little bit of blood and sometimes she brings it up without an associated cough  No weight loss Appetite maintained  Was not feeling acutely ill  No nasal stuffiness or congestion, no epistaxis  Symptoms have completely resolved with course of antibiotics and steroids, last time she brought up any secretions or blood was April 28 about a month ago  Never smoker Works in a nail salon  History of hypertension,  diabetes-not on any medications at present  Outpatient Encounter Medications as of 09/18/2020  Medication Sig  . valsartan (DIOVAN) 80 MG tablet Take 1 tablet (80 mg total) by mouth daily.  Marland Kitchen azithromycin (ZITHROMAX Z-PAK) 250 MG tablet Take 2 tablets by mouth once on day 1 followed by 1 tablet daily by mouth on days 2 through 5 (Patient not taking: Reported on 09/18/2020)  . benzonatate (TESSALON) 100 MG capsule Take 1 capsule (100 mg total) by mouth every 8 (eight) hours. (Patient not taking: Reported on 09/18/2020)  . predniSONE (STERAPRED UNI-PAK 21 TAB) 10 MG (21) TBPK tablet Take by mouth daily. Take 6 tabs by mouth daily  for 2 days, then 5 tabs for 2 days, then 4 tabs for 2 days, then 3 tabs for 2 days, 2 tabs for 2 days, then 1 tab by mouth daily for 2 days (Patient not taking: Reported on 09/18/2020)   No facility-administered encounter medications on file as of 09/18/2020.    Allergies as of 09/18/2020  . (No Known Allergies)    Past Medical History:  Diagnosis Date  . Diabetes mellitus without complication ([place])   . Hypertension     Past Surgical History:  Procedure Laterality Date  . RHINOPLASTY       Family History  Problem Relation Age of Onset  . Hypertension Father     Social History   Socioeconomic History  . Marital status: Divorced    Spouse name: Not on file  . Number of children: Not on file  . Years of education: Not on file  . Highest education level: Not on file  Occupational History  . Not on file  Tobacco Use  . Smoking status: Never Smoker  . Smokeless tobacco: Never Used  Substance and Sexual Activity  . Alcohol use: Not Currently    Comment: occasionally  . Drug use: No  . Sexual activity: Yes    Partners: Male    Birth control/protection: Condom    Comment: married  Other Topics Concern  . Not on file  Social History Narrative     Marital status: Married      Children:  2 children (18, 56)      Lives: with 2 children.      Employment:  Insurance claims handler; full time      Tobacco: none      Alcohol: none      Drugs: none      Exercise: none   Social Determinants of Health   Financial Resource Strain: Not on file  Food Insecurity: Not on file  Transportation Needs: Not on file  Physical Activity: Not on  file  Stress: Not on file  Social Connections: Not on file  Intimate Partner Violence: Not on file    Review of Systems  Constitutional: Negative for fatigue.  Respiratory: Negative for cough and shortness of breath.     Vitals:   09/18/20 0939  BP: (!) 142/88  Pulse: 92  SpO2: 98%     Physical Exam Constitutional:      Appearance: Normal appearance.  HENT:     Head: Normocephalic and atraumatic.     Nose: Nose normal. No congestion.     Mouth/Throat:     Mouth: Mucous membranes are moist.  Eyes:     General: No scleral icterus.       Right eye: No discharge.        Left eye: No discharge.     Pupils: Pupils are equal, round, and reactive to light.  Cardiovascular:     Rate and Rhythm: Normal rate and regular rhythm.     Heart sounds: No murmur heard. No friction rub.  Pulmonary:     Effort: Pulmonary effort is normal. No  respiratory distress.     Breath sounds: No stridor. No wheezing or rhonchi.  Musculoskeletal:     Cervical back: No rigidity or tenderness.  Neurological:     Mental Status: She is alert.  Psychiatric:        Mood and Affect: Mood normal.      Data Reviewed: Chest x-ray with hyperinflation  Assessment:  Hemoptysis-resolved with recent treatment with antibiotics and steroids  Diabetes Hypertension  Plan/Recommendations: Obtain a CT scan of the chest without contrast  Encouraged to call with any significant recurrence of symptoms  Tentative follow-up in 3 months  Sherrilyn Rist MD [facility] 09/18/2020, 9:58 AM  CC: Enrique Sack, FNP

## 2020-10-01 ENCOUNTER — Inpatient Hospital Stay: Admission: RE | Admit: 2020-10-01 | Payer: 59 | Source: Ambulatory Visit

## 2020-10-13 ENCOUNTER — Ambulatory Visit (INDEPENDENT_AMBULATORY_CARE_PROVIDER_SITE_OTHER)
Admission: RE | Admit: 2020-10-13 | Discharge: 2020-10-13 | Disposition: A | Discharge status: Home / Self Care | Payer: 59 | Source: Ambulatory Visit | Attending: Pulmonary Disease | Admitting: Pulmonary Disease

## 2020-10-13 ENCOUNTER — Other Ambulatory Visit: Payer: Self-pay

## 2020-10-13 DIAGNOSIS — R042 Hemoptysis: Secondary | ICD-10-CM

## 2020-11-03 ENCOUNTER — Encounter: Payer: Self-pay | Admitting: Family Medicine

## 2020-11-03 ENCOUNTER — Ambulatory Visit (INDEPENDENT_AMBULATORY_CARE_PROVIDER_SITE_OTHER): Payer: 59 | Admitting: Family Medicine

## 2020-11-03 ENCOUNTER — Other Ambulatory Visit: Payer: Self-pay

## 2020-11-03 VITALS — BP 111/75 | HR 83 | Temp 98.2°F | Ht 60.0 in | Wt 117.2 lb

## 2020-11-03 DIAGNOSIS — Z7689 Persons encountering health services in other specified circumstances: Secondary | ICD-10-CM | POA: Diagnosis not present

## 2020-11-03 DIAGNOSIS — I10 Essential (primary) hypertension: Secondary | ICD-10-CM

## 2020-11-03 NOTE — Assessment & Plan Note (Signed)
Currently stable on valsartan 80 mg daily.  BP in office 111/75.  Last CMP collected in December 2021 with no abnormalities. -Continue valsartan 80 mg -Consider rechecking BMP in 6 months

## 2020-11-03 NOTE — Patient Instructions (Addendum)
It was so great seeing you today!.  Your blood pressure is very well controlled on your current medications so we will keep it that way.  Your labs were recently done in December so we will check them at your next visit in 6 months.  If you need any medication refills please just let me know.  If you have any more episodes of coughing up blood I would like to know immediately.

## 2020-11-03 NOTE — Progress Notes (Signed)
New Patient Office Visit  Subjective:  Patient ID: Maria [name], female    DOB: 01-05-1971  Age: 50 y.o. MRN: [id number]  CC:  Chief Complaint  Patient presents with   Establish Care    HPI Maria [name] presents for establishing care.  She has no complaints today but is wanting to make sure that she is in good health at the moment  Does report that recently she was having issues with coughing up blood that initially was intermittent and then increased.  She was seen by her physician previously and was given azithromycin and prednisone with improvement and has not had any recurrence of this in the last several months.   Past Medical History:  Diagnosis Date   Diabetes mellitus without complication ([place])    Hypertension     Past Surgical History:  Procedure Laterality Date   RHINOPLASTY      Family History  Problem Relation Age of Onset   Hypertension Father     Social History   Socioeconomic History   Marital status: Divorced    Spouse name: Not on file   Number of children: Not on file   Years of education: Not on file   Highest education level: Not on file  Occupational History   Not on file  Tobacco Use   Smoking status: Never   Smokeless tobacco: Never  Substance and Sexual Activity   Alcohol use: Not Currently    Comment: occasionally   Drug use: No   Sexual activity: Yes    Partners: Male    Birth control/protection: Condom    Comment: married  Other Topics Concern   Not on file  Social History Narrative     Marital status: Married      Children:  2 children (53, 26)      Lives: with 2 children.      Employment:  Insurance claims handler; full time      Tobacco: none      Alcohol: none      Drugs: none      Exercise: none   Social Determinants of Radio broadcast assistant Strain: Not on file  Food Insecurity: Not on file  Transportation Needs: Not on file  Physical Activity: Not on file  Stress: Not on file  Social Connections: Not on file  Intimate  Partner Violence: Not on file    ROS Review of Systems  Constitutional:  Negative for unexpected weight change.  HENT:  Negative for congestion and sore throat.   Eyes:  Negative for visual disturbance.  Respiratory:  Negative for cough, shortness of breath and wheezing.   Cardiovascular:  Negative for chest pain, palpitations and leg swelling.  Gastrointestinal:  Negative for abdominal distention, abdominal pain, blood in stool, constipation, diarrhea, nausea and vomiting.  Endocrine: Negative for polyuria.  Genitourinary:  Negative for difficulty urinating and vaginal bleeding.  Neurological:  Negative for dizziness, weakness and headaches.   Objective:   Today's Vitals: BP 111/75   Pulse 83   Temp 98.2 F (36.8 C)   Ht 5' (1.524 m)   Wt 117 lb 3.2 oz (53.2 kg)   LMP 10/06/2020   SpO2 100%   BMI 22.89 kg/m   Physical Exam Vitals reviewed.  Constitutional:      Appearance: Normal appearance. She is normal weight.  HENT:     Head: Normocephalic and atraumatic.     Right Ear: Tympanic membrane normal.     Left Ear: Tympanic membrane normal.  Nose: Nose normal.     Mouth/Throat:     Mouth: Mucous membranes are moist.     Pharynx: Oropharynx is clear.  Eyes:     Extraocular Movements: Extraocular movements intact.     Conjunctiva/sclera: Conjunctivae normal.     Pupils: Pupils are equal, round, and reactive to light.  Cardiovascular:     Rate and Rhythm: Normal rate and regular rhythm.     Heart sounds: No murmur heard. Pulmonary:     Effort: Pulmonary effort is normal.     Breath sounds: Normal breath sounds. No rhonchi or rales.  Abdominal:     General: Abdomen is flat. Bowel sounds are normal.     Palpations: Abdomen is soft.  Musculoskeletal:        General: Normal range of motion.     Cervical back: Normal range of motion and neck supple.  Skin:    General: Skin is warm and dry.  Neurological:     General: No focal deficit present.     Mental Status:  She is alert and oriented to person, place, and time.  Psychiatric:        Mood and Affect: Mood normal.        Behavior: Behavior normal.    Assessment & Plan:   Problem List Items Addressed This Visit       Cardiovascular and Mediastinum   Benign essential HTN - Primary    Currently stable on valsartan 80 mg daily.  BP in office 111/75.  Last CMP collected in December 2021 with no abnormalities. -Continue valsartan 80 mg -Consider rechecking BMP in 6 months       Other Visit Diagnoses     Encounter to establish care           Outpatient Encounter Medications as of 11/03/2020  Medication Sig   valsartan (DIOVAN) 80 MG tablet Take 1 tablet (80 mg total) by mouth daily.   [DISCONTINUED] azithromycin (ZITHROMAX Z-PAK) 250 MG tablet Take 2 tablets by mouth once on day 1 followed by 1 tablet daily by mouth on days 2 through 5 (Patient not taking: No sig reported)   [DISCONTINUED] benzonatate (TESSALON) 100 MG capsule Take 1 capsule (100 mg total) by mouth every 8 (eight) hours. (Patient not taking: No sig reported)   [DISCONTINUED] predniSONE (STERAPRED UNI-PAK 21 TAB) 10 MG (21) TBPK tablet Take by mouth daily. Take 6 tabs by mouth daily  for 2 days, then 5 tabs for 2 days, then 4 tabs for 2 days, then 3 tabs for 2 days, 2 tabs for 2 days, then 1 tab by mouth daily for 2 days (Patient not taking: No sig reported)   No facility-administered encounter medications on file as of 11/03/2020.    Follow-up: Return in about 6 months (around 05/06/2021) for Labs and HTN f/u. Labs to consider: TSH, BMP, lipid panel  Maria Randle, DO

## 2021-02-17 ENCOUNTER — Ambulatory Visit
Admission: RE | Admit: 2021-02-17 | Discharge: 2021-02-17 | Disposition: A | Discharge status: Home / Self Care | Payer: 59 | Source: Ambulatory Visit | Attending: Internal Medicine | Admitting: Internal Medicine

## 2021-02-17 ENCOUNTER — Emergency Department (HOSPITAL_COMMUNITY)
Admission: EM | Admit: 2021-02-17 | Discharge: 2021-02-17 | Disposition: A | Discharge status: Home / Self Care | Payer: 59 | Attending: Emergency Medicine | Admitting: Emergency Medicine

## 2021-02-17 ENCOUNTER — Other Ambulatory Visit: Payer: Self-pay

## 2021-02-17 ENCOUNTER — Ambulatory Visit (INDEPENDENT_AMBULATORY_CARE_PROVIDER_SITE_OTHER): Payer: 59

## 2021-02-17 VITALS — BP 164/88 | HR 91 | Temp 98.9°F | Resp 18

## 2021-02-17 DIAGNOSIS — M545 Low back pain, unspecified: Secondary | ICD-10-CM

## 2021-02-17 DIAGNOSIS — M549 Dorsalgia, unspecified: Secondary | ICD-10-CM | POA: Diagnosis not present

## 2021-02-17 DIAGNOSIS — G8929 Other chronic pain: Secondary | ICD-10-CM

## 2021-02-17 DIAGNOSIS — M5413 Radiculopathy, cervicothoracic region: Secondary | ICD-10-CM | POA: Insufficient documentation

## 2021-02-17 DIAGNOSIS — M546 Pain in thoracic spine: Secondary | ICD-10-CM | POA: Diagnosis not present

## 2021-02-17 DIAGNOSIS — R200 Anesthesia of skin: Secondary | ICD-10-CM | POA: Diagnosis present

## 2021-02-17 MED ORDER — PREDNISONE 20 MG PO TABS: 40.0000 mg | ORAL_TABLET | Freq: Every day | ORAL | 0 refills | Status: AC

## 2021-02-17 NOTE — ED Triage Notes (Signed)
Pt was seen earlier today for c/o upper back and arm numbness. Had negative work up and was instructed to follow up with neurosurgery. Pt come to ED to see neurosurgery. PA at bedside explaining process on follow up appointment.

## 2021-02-17 NOTE — ED Provider Notes (Signed)
[facility] EMERGENCY DEPARTMENT Provider Note   CSN: [id number] Arrival date & time: 02/17/21  2151     History Chief Complaint  Patient presents with   Numbness    Maria [name] is a 50 y.o. female.  Patient to ED with complaint of mid and upper back pain that is chronic. She experiences numbness and tingling in both upper extremities without weakness. She was seen today, had xrays done of her entire back and was referred to neurosurgery in the outpatient setting. She was prescribed prednisone. She states she came here because she felt she could be seen faster. No new symptoms. No fever.   The history is provided by the patient. A language interpreter was used.      Past Medical History:  Diagnosis Date   Diabetes mellitus without complication ([place])    Hypertension     Patient Active Problem List   Diagnosis Date Noted   Benign essential HTN 05/05/2020   Vitamin D deficiency 05/05/2020   Pure hypercholesterolemia 05/05/2020   Headache disorder 03/31/2017   Uterine leiomyoma 08/03/2016   Nocturia 05/23/2016   Urinary frequency 05/16/2016   Right thyroid nodule 03/28/2016    Past Surgical History:  Procedure Laterality Date   RHINOPLASTY       OB History     Gravida  2   Para  2   Term  2   Preterm      AB      Living         SAB      IAB      Ectopic      Multiple      Live Births              Family History  Problem Relation Age of Onset   Hypertension Father     Social History   Tobacco Use   Smoking status: Never   Smokeless tobacco: Never  Vaping Use   Vaping Use: Never used  Substance Use Topics   Alcohol use: Not Currently    Comment: occasionally   Drug use: No    Home Medications Prior to Admission medications   Medication Sig Start Date End Date Taking? Authorizing Provider  predniSONE (DELTASONE) 20 MG tablet Take 2 tablets (40 mg total) by mouth daily for 5 days. 02/17/21 02/22/21  Teodora Medici, FNP  valsartan (DIOVAN) 80 MG tablet Take 1 tablet (80 mg total) by mouth daily. 05/05/20   Just, Laurita Quint, FNP    Allergies    Patient has no known allergies.  Review of Systems   Review of Systems  Constitutional:  Negative for chills and fever.  HENT: Negative.    Respiratory: Negative.    Cardiovascular: Negative.   Gastrointestinal: Negative.  Negative for nausea.  Musculoskeletal:  Positive for back pain and neck pain.  Skin: Negative.   Neurological:  Positive for numbness. Negative for weakness and headaches.   Physical Exam Updated Vital Signs BP (!) 146/90 (BP Location: Left Arm)   Pulse 99   Temp 98.8 F (37.1 C) (Oral)   Resp 16   LMP 01/23/2021   SpO2 100%   Physical Exam Vitals and nursing note reviewed.  Constitutional:      Appearance: She is well-developed.  HENT:     Head: Normocephalic.  Cardiovascular:     Rate and Rhythm: Normal rate and regular rhythm.     Heart sounds: No murmur heard. Pulmonary:  Effort: Pulmonary effort is normal.     Breath sounds: Normal breath sounds. No wheezing, rhonchi or rales.  Abdominal:     General: Bowel sounds are normal.     Palpations: Abdomen is soft.     Tenderness: There is no abdominal tenderness. There is no guarding or rebound.  Musculoskeletal:        General: Normal range of motion.     Cervical back: Normal range of motion and neck supple.  Skin:    General: Skin is warm and dry.  Neurological:     General: No focal deficit present.     Mental Status: She is alert and oriented to person, place, and time.     Sensory: No sensory deficit.     Motor: No weakness.     Coordination: Coordination normal.     Gait: Gait normal.     Deep Tendon Reflexes: Reflexes normal.    ED Results / Procedures / Treatments   Labs (all labs ordered are listed, but only abnormal results are displayed) Labs Reviewed - No data to display  EKG None  Radiology DG Cervical Spine 2-3 Views  Result  Date: 02/17/2021 CLINICAL DATA:  Upper back pain for 8 weeks EXAM: CERVICAL SPINE - 2-3 VIEW COMPARISON:  09/13/2017 FINDINGS: There is no evidence of cervical spine fracture or prevertebral soft tissue swelling. Straightening of the cervical lordosis without static listhesis. Intervertebral disc heights are preserved. No other significant bone abnormalities are identified. IMPRESSION: 1. No acute osseous abnormality of the cervical spine. 2. Straightening of the cervical lordosis without static listhesis. Electronically Signed   By: Davina Poke D.O.   On: 02/17/2021 11:52   DG Thoracic Spine 2 View  Result Date: 02/17/2021 CLINICAL DATA:  Back pain for 8 weeks EXAM: THORACIC SPINE 2 VIEWS COMPARISON:  10/13/2020 FINDINGS: There is no evidence of thoracic spine fracture. Alignment is normal. Disc heights are preserved. No other significant bone abnormalities are identified. IMPRESSION: Negative. Electronically Signed   By: Davina Poke D.O.   On: 02/17/2021 11:52   DG Lumbar Spine Complete  Result Date: 02/17/2021 CLINICAL DATA:  Back pain. EXAM: LUMBAR SPINE - COMPLETE 4+ VIEW COMPARISON:  None. FINDINGS: There is no evidence of lumbar spine fracture. Alignment is normal. Intervertebral disc spaces are maintained. IMPRESSION: Negative. Electronically Signed   By: Misty Stanley M.D.   On: 02/17/2021 11:49    Procedures Procedures   Medications Ordered in ED Medications - No data to display  ED Course  I have reviewed the triage vital signs and the nursing notes.  Pertinent labs & imaging results that were available during my care of the patient were reviewed by me and considered in my medical decision making (see chart for details).    MDM Rules/Calculators/A&P                           Patient to ED with chronic back and neck pain now with radicular symptoms of UE tingling and numbness without weakness.   On my exam, there are no sensory or strength deficits. Normal  coordination. She appears in NAD. VSS.   Via interpreter, she was reassured there is no emergency condition present, and that the outpatient referral was appropriate. She can be discharged home and is told she is welcome to return to the ED at any time she feels there is an acute change or concerning symptom.  Final Clinical Impression(s) / ED Diagnoses  Final diagnoses:  Radiculopathy of cervicothoracic region    Rx / DC Orders ED Discharge Orders     None        Dennie Bible 02/17/21 2326    Drenda Freeze, MD 02/18/21 1515

## 2021-02-17 NOTE — ED Provider Notes (Signed)
[facility]    CSN: [id number] Arrival date & time: 02/17/21  0856      History   Chief Complaint Chief Complaint  Patient presents with   Back Pain   Appointment    9:00    HPI Maria [name] is a 50 y.o. female.   Patient presents with back pain throughout entire back that has been present for approximately 8 weeks.  Patient reports that this back pain has been intermittent since 2016.  Patient recently saw [facility] in 2016 and was provided a medication that she cannot remember the name of.  Was then seen by another [facility] approximately 3 years ago for same back pain and was prescribed a different medication that she cannot remove the name of.  Both medications provided her some temporary relief.  Pain flared up again in August 2022 when she was seen by [place] doctor.  Imaging of the back was completed that showed a "disc herniation" in the lower portion of her back per patient.  Patient currently has records of the visit in [place] but they are all in [place].  Patient has not taken any medication to help alleviate her back pain since this flareup started.  Denies any past or current injuries but does state that she bent over to pick up a suitcase when the current back pain started approximately 8 weeks ago prior to her visit to [place].  Patient denies any numbness or tingling or radiation down the legs but does report that there is pain, numbness, tingling that radiates to bilateral arms.  Has full range of motion of arms.  Numbness and tingling occurs mainly in the morning and during the night while she sleeping. Denies any urinary frequency, urinary burning, saddle anesthesia, urinary or bowel incontinence.   Back Pain  Past Medical History:  Diagnosis Date   Diabetes mellitus without complication ([place])    Hypertension     Patient Active Problem List   Diagnosis Date Noted   Benign essential HTN 05/05/2020   Vitamin D deficiency 05/05/2020    Pure hypercholesterolemia 05/05/2020   Headache disorder 03/31/2017   Uterine leiomyoma 08/03/2016   Nocturia 05/23/2016   Urinary frequency 05/16/2016   Right thyroid nodule 03/28/2016    Past Surgical History:  Procedure Laterality Date   RHINOPLASTY      OB History     Gravida  2   Para  2   Term  2   Preterm      AB      Living         SAB      IAB      Ectopic      Multiple      Live Births               Home Medications    Prior to Admission medications   Medication Sig Start Date End Date Taking? Authorizing Provider  predniSONE (DELTASONE) 20 MG tablet Take 2 tablets (40 mg total) by mouth daily for 5 days. 02/17/21 02/22/21 Yes Corvette Orser, Michele Rockers, FNP  valsartan (DIOVAN) 80 MG tablet Take 1 tablet (80 mg total) by mouth daily. 05/05/20   Just, Laurita Quint, FNP    Family History Family History  Problem Relation Age of Onset   Hypertension Father     Social History Social History   Tobacco Use   Smoking status: Never   Smokeless tobacco: Never  Vaping Use   Vaping Use: Never used  Substance Use Topics   Alcohol use: Not Currently    Comment: occasionally   Drug use: No     Allergies   Patient has no known allergies.   Review of Systems Review of Systems Per HPI  Physical Exam Triage Vital Signs ED Triage Vitals  Enc Vitals Group     BP 02/17/21 1001 (!) 164/88     Pulse Rate 02/17/21 1001 91     Resp 02/17/21 1001 18     Temp 02/17/21 1001 98.9 F (37.2 C)     Temp Source 02/17/21 1001 Oral     SpO2 02/17/21 1001 98 %     Weight --      Height --      Head Circumference --      Peak Flow --      Pain Score 02/17/21 0957 7     Pain Loc --      Pain Edu? --      Excl. in [place]? --    No data found.  Updated Vital Signs BP (!) 164/88 (BP Location: Right Arm)   Pulse 91   Temp 98.9 F (37.2 C) (Oral)   Resp 18   LMP 01/23/2021   SpO2 98%   Visual Acuity Right Eye Distance:   Left Eye Distance:   Bilateral  Distance:    Right Eye Near:   Left Eye Near:    Bilateral Near:     Physical Exam Constitutional:      General: She is not in acute distress.    Appearance: Normal appearance. She is not ill-appearing, toxic-appearing or diaphoretic.  HENT:     Head: Normocephalic and atraumatic.  Eyes:     Extraocular Movements: Extraocular movements intact.     Conjunctiva/sclera: Conjunctivae normal.  Cardiovascular:     Rate and Rhythm: Normal rate and regular rhythm.     Pulses: Normal pulses.     Heart sounds: Normal heart sounds.  Pulmonary:     Effort: Pulmonary effort is normal. No respiratory distress.     Breath sounds: Normal breath sounds.  Musculoskeletal:     Cervical back: No swelling, edema, deformity, erythema, tenderness, bony tenderness or crepitus. No pain with movement. Normal range of motion.     Thoracic back: No swelling, edema, deformity, signs of trauma, tenderness or bony tenderness. Normal range of motion.     Lumbar back: No swelling, edema, spasms, tenderness or bony tenderness. Normal range of motion. Negative right straight leg raise test and negative left straight leg raise test.  Skin:    General: Skin is warm and dry.  Neurological:     General: No focal deficit present.     Mental Status: She is alert and oriented to person, place, and time. Mental status is at baseline.     Deep Tendon Reflexes: Reflexes are normal and symmetric.  Psychiatric:        Mood and Affect: Mood normal.        Behavior: Behavior normal.        Thought Content: Thought content normal.        Judgment: Judgment normal.     UC Treatments / Results  Labs (all labs ordered are listed, but only abnormal results are displayed) Labs Reviewed - No data to display  EKG   Radiology DG Cervical Spine 2-3 Views  Result Date: 02/17/2021 CLINICAL DATA:  Upper back pain for 8 weeks EXAM: CERVICAL SPINE - 2-3 VIEW COMPARISON:  09/13/2017 FINDINGS: There  is no evidence of cervical  spine fracture or prevertebral soft tissue swelling. Straightening of the cervical lordosis without static listhesis. Intervertebral disc heights are preserved. No other significant bone abnormalities are identified. IMPRESSION: 1. No acute osseous abnormality of the cervical spine. 2. Straightening of the cervical lordosis without static listhesis. Electronically Signed   By: Davina Poke D.O.   On: 02/17/2021 11:52   DG Thoracic Spine 2 View  Result Date: 02/17/2021 CLINICAL DATA:  Back pain for 8 weeks EXAM: THORACIC SPINE 2 VIEWS COMPARISON:  10/13/2020 FINDINGS: There is no evidence of thoracic spine fracture. Alignment is normal. Disc heights are preserved. No other significant bone abnormalities are identified. IMPRESSION: Negative. Electronically Signed   By: Davina Poke D.O.   On: 02/17/2021 11:52   DG Lumbar Spine Complete  Result Date: 02/17/2021 CLINICAL DATA:  Back pain. EXAM: LUMBAR SPINE - COMPLETE 4+ VIEW COMPARISON:  None. FINDINGS: There is no evidence of lumbar spine fracture. Alignment is normal. Intervertebral disc spaces are maintained. IMPRESSION: Negative. Electronically Signed   By: Misty Stanley M.D.   On: 02/17/2021 11:49    Procedures Procedures (including critical care time)  Medications Ordered in UC Medications - No data to display  Initial Impression / Assessment and Plan / UC Course  I have reviewed the triage vital signs and the nursing notes.  Pertinent labs & imaging results that were available during my care of the patient were reviewed by me and considered in my medical decision making (see chart for details).     All spinal x-rays were negative for any acute abnormality.  While discussing x-ray results, patient's daughter reported that she does have urinary frequency and sometimes urinary incontinence that has been present for multiple years.  Patient reports that she was seen by urologist and was prescribed a medication that she cannot  remember the name of that helped but she quit taking it.  Patient denies that urinary frequency or urinary incontinence has worsened since back pain started.  Patient was advised that it would be best for her to go to the hospital for further imaging and evaluation given urinary frequency and incontinence associated with back pain.  Patient refused to go to the hospital.  Risk associated with not going to hospital were discussed with patient.  Do not think that urinalysis is necessary given the duration of symptoms and due to patient already being evaluated by urology.  It is possible that urinary symptoms and back pain are not related.  Will prescribe prednisone x5 days to help decrease inflammation in back as cervical radiculopathy seems to be present.  Patient was provided with contact information for spine specialist for further evaluation and management.  No other red flags seen on exam.Discussed strict return precautions. Patient verbalized understanding and is agreeable with plan.  Interpreter used throughout patient interaction.   Final Clinical Impressions(s) / UC Diagnoses   Final diagnoses:  Chronic bilateral back pain, unspecified back location     Discharge Instructions      Please follow-up with provided contact information for spine specialist for further evaluation and management.  You have been prescribed prednisone steroid to decrease inflammation in the back and help alleviate symptoms.     ED Prescriptions     Medication Sig Dispense Auth. Provider   predniSONE (DELTASONE) 20 MG tablet Take 2 tablets (40 mg total) by mouth daily for 5 days. 10 tablet Teodora Medici, [place]      PDMP not reviewed this encounter.  Teodora Medici, [place] 02/17/21 1221

## 2021-02-17 NOTE — ED Triage Notes (Signed)
Upper and lower back and both arms.  Onset 8 weeks ago

## 2021-02-17 NOTE — Discharge Instructions (Signed)
Follow up with the neurosurgeon where you were referred today. Take the prednisone that was prescribed earlier today.   If you develop new symptoms of concern to you - weakness of arms or legs, fever, severe pain - return to the ED.

## 2021-02-17 NOTE — ED Notes (Signed)
Discharge instructions discussed with pt including prescription and follow up care with neurosurgery. Interpreter Zoe [phone]

## 2021-02-17 NOTE — Discharge Instructions (Signed)
Please follow-up with provided contact information for spine specialist for further evaluation and management.  You have been prescribed prednisone steroid to decrease inflammation in the back and help alleviate symptoms.

## 2021-04-21 ENCOUNTER — Ambulatory Visit (INDEPENDENT_AMBULATORY_CARE_PROVIDER_SITE_OTHER): Payer: 59 | Admitting: Family Medicine

## 2021-04-21 ENCOUNTER — Other Ambulatory Visit: Payer: Self-pay

## 2021-04-21 ENCOUNTER — Encounter: Payer: Self-pay | Admitting: Family Medicine

## 2021-04-21 VITALS — BP 138/78 | HR 98 | Ht 60.0 in | Wt 117.2 lb

## 2021-04-21 DIAGNOSIS — M545 Low back pain, unspecified: Secondary | ICD-10-CM | POA: Diagnosis not present

## 2021-04-21 DIAGNOSIS — E559 Vitamin D deficiency, unspecified: Secondary | ICD-10-CM | POA: Diagnosis not present

## 2021-04-21 DIAGNOSIS — I10 Essential (primary) hypertension: Secondary | ICD-10-CM | POA: Diagnosis not present

## 2021-04-21 DIAGNOSIS — G8929 Other chronic pain: Secondary | ICD-10-CM

## 2021-04-21 DIAGNOSIS — E7800 Pure hypercholesterolemia, unspecified: Secondary | ICD-10-CM

## 2021-04-21 MED ORDER — VALSARTAN 80 MG PO TABS
80.0000 mg | ORAL_TABLET | Freq: Every day | ORAL | 3 refills | Status: DC
Start: 2021-04-21 — End: 2021-12-22

## 2021-04-21 MED ORDER — DICLOFENAC SODIUM 1 % EX GEL: 2.0000 g | Freq: Four times a day (QID) | CUTANEOUS | 0 refills | Status: DC

## 2021-04-21 NOTE — Assessment & Plan Note (Signed)
Patient concerned about bone health, has a history of vitamin D deficiency.  Currently wanting to discuss supplementation. - Recheck vitamin D level today

## 2021-04-21 NOTE — Assessment & Plan Note (Signed)
BP in office 138/78, currently on valsartan 80 mg daily.  Discussed with patient to check blood pressures at least once a day and follow-up in clinic in the next 2 to 4 weeks with her BP log. - Check BMP - Refilled valsartan 80 mg daily - Follow-up in 2 to 4 weeks with BP log

## 2021-04-21 NOTE — Patient Instructions (Addendum)
Your blood pressure today was a little bit elevated. I am sending in a refill of your medication and want you to check your blood pressure at least once per day and keep a log of these numbers.  I want you to bring that log with you to your next visit in the next 2 to 4 weeks.    We are going to check your kidney function today, cholesterol, vitamin D level.  I will either send you a MyChart message with your results or will call you if they are abnormal.  For your pain I recommend using muscle creams that are over-the-counter, heating pad, Tylenol or ibuprofen, lidocaine patches the can get over-the-counter as well.  We will send in a prescription for Voltaren gel, if this is too expensive then does get the over-the-counter muscle creams.  To make sure you have good bone health I do recommend resistance training with either resistance bands or starting with light weight lifting as able.

## 2021-04-21 NOTE — Assessment & Plan Note (Signed)
Patient's only comorbid condition at this time is hypertension.  Previous A1c checks have all been within normal limits - Checking lipid panel today

## 2021-04-21 NOTE — Progress Notes (Signed)
° ° °  SUBJECTIVE:   CHIEF COMPLAINT / HPI:   InterpreterJan [name]  Patient presents for follow-up from previous visits.  She notes that for her blood pressures she has been only checking whenever she does not feel well and notes that it is typically in the 510C systolic.  She does note that its been a while since she is checked her blood pressure.  She has been taking her valsartan 80 mg daily as prescribed and needs a refill.  She also notes that she has shoulder pain and back pain that have been going on for a while.    She was seen at an urgent care in October and there was concern for a possible disc herniation, patient was given prednisone and recommended that she follow-up with neurosurgery.  She and daughter state that she saw neurosurgery and there was no concerns and she is not prescribed any medication (I am not able to see those notes so I am unsure if it was the ER or if it was a neurosurgery specialist).  She reports that she has a general regimen that she is able to get around pretty well.  She does note more cracking noises when she is waking up in the morning but there is no pain associated with them.  She is also worried about her bone health and wanting to make sure if she is able to take supplements and then be okay.  She is wondering if she is getting close to menopause.   PERTINENT  PMH / PSH: Reviewed  OBJECTIVE:   BP 138/78    Pulse 98    Ht 5' (1.524 m)    Wt 117 lb 3.2 oz (53.2 kg)    LMP 04/17/2021    SpO2 100%    BMI 22.89 kg/m   General: NAD, well-appearing, well-nourished Respiratory: No respiratory distress, breathing comfortably, able to speak in full sentences Skin: warm and dry, no rashes noted on exposed skin Psych: Appropriate affect and mood MSK: full ROM of the bilateral shoulders and flexion and extension of the upper and lower back. No deformities on exam and patient's gait is normal.  ASSESSMENT/PLAN:   Benign essential HTN BP in office  138/78, currently on valsartan 80 mg daily.  Discussed with patient to check blood pressures at least once a day and follow-up in clinic in the next 2 to 4 weeks with her BP log. - Check BMP - Refilled valsartan 80 mg daily - Follow-up in 2 to 4 weeks with BP log  Vitamin D deficiency Patient concerned about bone health, has a history of vitamin D deficiency.  Currently wanting to discuss supplementation. - Recheck vitamin D level today  Pure hypercholesterolemia Patient's only comorbid condition at this time is hypertension.  Previous A1c checks have all been within normal limits - Checking lipid panel today   Back and shoulder pain Appear to be more MSK related, patient not have any issues with ROM and does not have any red flag symptoms.  Has been evaluated by neurosurgery without any concerns.  Discussed conservative manage with OTC medications, muscle cream, heating pad.  Also recommended increasing use of weight resistance during exercises and daily stretching. - Voltaren gel sent in - Discussed conservative measures - Return precautions given  Healthcare maintenance - Discuss colonoscopy referral at next visit  Maria [name], Maria [name]

## 2021-04-22 ENCOUNTER — Other Ambulatory Visit: Payer: Self-pay | Admitting: Family Medicine

## 2021-04-22 LAB — VITAMIN D 25 HYDROXY (VIT D DEFICIENCY, FRACTURES): Vit D, 25-Hydroxy: 25.7 ng/mL — ABNORMAL LOW (ref 30.0–100.0)

## 2021-04-22 LAB — BASIC METABOLIC PANEL
BUN/Creatinine Ratio: 20 (ref 9–23)
BUN: 14 mg/dL (ref 6–24)
CO2: 25 mmol/L (ref 20–29)
Calcium: 9.7 mg/dL (ref 8.7–10.2)
Chloride: 103 mmol/L (ref 96–106)
Creatinine, Ser: 0.7 mg/dL (ref 0.57–1.00)
Glucose: 91 mg/dL (ref 70–99)
Potassium: 4.2 mmol/L (ref 3.5–5.2)
Sodium: 140 mmol/L (ref 134–144)
eGFR: 106 mL/min/{1.73_m2} (ref >59)

## 2021-04-22 LAB — LIPID PANEL
Chol/HDL Ratio: 2.7 ratio (ref 0.0–4.4)
Cholesterol, Total: 216 mg/dL — ABNORMAL HIGH (ref 100–199)
HDL: 81 mg/dL (ref >39)
LDL Chol Calc (NIH): 121 mg/dL — ABNORMAL HIGH (ref 0–99)
Triglycerides: 79 mg/dL (ref 0–149)
VLDL Cholesterol Cal: 14 mg/dL (ref 5–40)

## 2021-04-22 MED ORDER — CHOLECALCIFEROL 1.25 MG (50000 UT) PO CAPS: 50000.0000 [IU] | ORAL_CAPSULE | ORAL | 0 refills | Status: DC

## 2021-05-28 ENCOUNTER — Other Ambulatory Visit: Payer: Self-pay

## 2021-05-28 ENCOUNTER — Ambulatory Visit (INDEPENDENT_AMBULATORY_CARE_PROVIDER_SITE_OTHER): Payer: 59 | Admitting: Family Medicine

## 2021-05-28 ENCOUNTER — Encounter: Payer: Self-pay | Admitting: Family Medicine

## 2021-05-28 VITALS — BP 125/75 | HR 86 | Ht 62.0 in | Wt 116.8 lb

## 2021-05-28 DIAGNOSIS — I10 Essential (primary) hypertension: Secondary | ICD-10-CM | POA: Diagnosis not present

## 2021-05-28 DIAGNOSIS — M549 Dorsalgia, unspecified: Secondary | ICD-10-CM

## 2021-05-28 NOTE — Progress Notes (Signed)
° ° °  SUBJECTIVE:   CHIEF COMPLAINT / HPI:   Patient presents for blood pressure follow-up.  She has not been checking her blood pressure at home, has not been having any hypo or hypertensive symptoms.  Patient has a noticeable last 2 to 3 weeks she has been having upper back pain.  She notes that it is worse with movement but that it is better when she does yoga at night before bed.  She works at a [employer] and is constantly bending over and has noticed that that movement sometimes causes some pain as well  PERTINENT  PMH / PSH: Reviewed  OBJECTIVE:   BP 125/75    Pulse 86    Ht 5\' 2"  (1.575 m)    Wt 116 lb 12.8 oz (53 kg)    LMP 05/15/2021 (Exact Date)    SpO2 100%    BMI 21.36 kg/m   General: NAD, well-appearing, well-nourished Respiratory: No respiratory distress, breathing comfortably, able to speak in full sentences Skin: warm and dry, no rashes noted on exposed skin Psych: Appropriate affect and mood MSK: Thoracic spine nontender to palpation, full ROM present.  No pain with palpation over the trapezius and rhomboid muscles.  Some discomfort with flexion of the head along the paraspinal muscles.  ASSESSMENT/PLAN:   Upper back pain No red flags.  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  Counseled patient to take frequent breaks, work on posture during sitting and to work on chest and upper back exercises to strengthen the muscles so there is no overcompensation. - Patient to follow-up in the next several weeks if worsening pain - Ibuprofen and Tylenol as needed - Counseled on exercise types to help strengthen the muscles.  BP check  Patient is not much able pressures at home, is improved today in office to 125/75. - Continue valsartan 80 mg   Archit Leger, DO [place]

## 2021-05-28 NOTE — Patient Instructions (Signed)
Your blood pressure looks much better, please check with your pharmacy if you have refills of your medication.    Your upper back pain is likely due to posture issues with ear drum.  I recommend taking up for breaks and making sure to sit up straight as you can.  I also recommend trying to do exercises that can strengthen your chest and back such as push-ups.  He can take Tylenol for the pain as well.

## 2021-06-09 ENCOUNTER — Other Ambulatory Visit: Payer: Self-pay | Admitting: Family Medicine

## 2021-07-23 ENCOUNTER — Encounter: Payer: Self-pay | Admitting: Family Medicine

## 2021-07-23 ENCOUNTER — Ambulatory Visit: Payer: 59

## 2021-07-23 ENCOUNTER — Ambulatory Visit (INDEPENDENT_AMBULATORY_CARE_PROVIDER_SITE_OTHER): Payer: 59 | Admitting: Family Medicine

## 2021-07-23 ENCOUNTER — Ambulatory Visit (HOSPITAL_COMMUNITY)
Admission: RE | Admit: 2021-07-23 | Discharge: 2021-07-23 | Disposition: A | Discharge status: Home / Self Care | Payer: 59 | Source: Ambulatory Visit | Attending: Family Medicine | Admitting: Family Medicine

## 2021-07-23 VITALS — BP 126/78 | HR 80 | Wt 114.0 lb

## 2021-07-23 DIAGNOSIS — M549 Dorsalgia, unspecified: Secondary | ICD-10-CM | POA: Insufficient documentation

## 2021-07-23 DIAGNOSIS — R350 Frequency of micturition: Secondary | ICD-10-CM

## 2021-07-23 DIAGNOSIS — G8929 Other chronic pain: Secondary | ICD-10-CM | POA: Insufficient documentation

## 2021-07-23 LAB — POCT URINALYSIS DIP (MANUAL ENTRY)
Bilirubin, UA: NEGATIVE
Blood, UA: NEGATIVE
Glucose, UA: NEGATIVE mg/dL
Ketones, POC UA: NEGATIVE mg/dL
Nitrite, UA: NEGATIVE
Protein Ur, POC: NEGATIVE mg/dL
Spec Grav, UA: 1.025 (ref 1.010–1.025)
Urobilinogen, UA: 0.2 E.U./dL
pH, UA: 5.5 (ref 5.0–8.0)

## 2021-07-23 LAB — POCT UA - MICROSCOPIC ONLY

## 2021-07-23 MED ORDER — BACLOFEN 10 MG PO TABS: 10.0000 mg | ORAL_TABLET | Freq: Three times a day (TID) | ORAL | 0 refills | Status: DC

## 2021-07-23 NOTE — Assessment & Plan Note (Addendum)
Patient with chronic back pain that has been evaluated several times.  X-rays completed in 2022 with no focal abnormality.  Patient reports getting worse, physical exam is not remarkable today the patient reporting worsening pain.  Patient notes she has been given medications previously that have eliminated her pain in the past.  At this time, appears to be more muscular related, but will obtain x-rays of the back to ensure no acute findings. No red flag symptoms.  ?- DG thoracic and lumbar spine ?- Trial of baclofen ?- Patient to continue previously given exercises ?- Continue to work on posture when at work ?

## 2021-07-23 NOTE — Progress Notes (Signed)
? ? ?  SUBJECTIVE:  ? ?CHIEF COMPLAINT / HPI:  ? ?Patient presents with daughter, video [place] interpreter used during entire encounter.   ? ?Patient reports that she has continued to have back pain, which she has been seen in the clinic several x4.  She is wanting to know if she needs x-rays.  She notes that today she is not feeling the pain as much because she has been limiting herself today and is more relaxed at this time.  She describes the pain as going "all over my body".  She does note that it is mainly in her back, specifically thoracic and lumbar spine area.  She cannot localize much further.  She typically only shows back up to the clinic whenever she determines that she cannot handle the pain, and notes that several times she has been given medications that have helped it.  She reports that hyperextending her back improves the pain and there is more pain/stretch with flexion.  The exercises she has previously been given have somewhat helped her symptoms, but most recently the pain is getting worse and there has been no acute injury.  Patient reports no urinary symptoms ? ?Patient is also wanting to discuss that she intermittently will have bloody sputum, but unable to address this at this visit due to time constraints. ? ?Patient is also wanting to have her physical scheduled so that she can get all of the cancer checkups. ? ?PERTINENT  PMH / PSH: Reviewed ? ?OBJECTIVE:  ? ?BP 126/78   Pulse 80   Wt 114 lb (51.7 kg)   SpO2 98%   BMI 20.85 kg/m?   ?General: NAD, well-appearing, well-nourished ?Respiratory: No respiratory distress, breathing comfortably, able to speak in full sentences ?Skin: warm and dry, no rashes noted on exposed skin ?Psych: Appropriate affect and mood ?MSK: Thoracic and lumbar spine nontender to palpation, spinal muscles nontender to palpation today, no focal hypertonicity noted on examination.  Patient has full ROM of lumbar and thoracic spine, normal sensation in upper and  lower extremities, 5/5 strength in bilateral upper and lower extremities ? ?ASSESSMENT/PLAN:  ? ?Chronic bilateral back pain ?Patient with chronic back pain that has been evaluated several times.  X-rays completed in 2022 with no focal abnormality.  Patient reports getting worse, physical exam is not remarkable today the patient reporting worsening pain.  At this time, appears to be more muscular related, but will obtain x-rays of the back to ensure no acute findings. ?- DG thoracic and lumbar spine ?- Trial of baclofen ?- Patient to continue previously given exercises ?- Continue to work on posture when at work ?  ?Physical and intermittent bloody sputum ?Patient instructed to follow-up in the next month for physical appointment.  Needs to be a 40-minute slot due to continued time constraints with this patient and the interpreter. ? ? ? ?Margareta Laureano, DO ?[place]  ?

## 2021-07-23 NOTE — Patient Instructions (Signed)
-   I am sending in a muscle relaxer to help [place] determine if it is your muscles or your bones that are hurting you. You can take this 3 times per day as needed.  ? ?- you will need a separate appointment to schedule a physical ? ?- We can follow-up in 1-2 weeks if the pain is not improved, but this will need to be a different appointment than the physical. ? ?- We will also get x-rays ordered, you can go to [place] at the [facility] to have them done.  ?

## 2021-07-30 ENCOUNTER — Other Ambulatory Visit: Payer: Self-pay | Admitting: Family Medicine

## 2021-08-10 ENCOUNTER — Encounter: Payer: Self-pay | Admitting: Family Medicine

## 2021-08-10 ENCOUNTER — Ambulatory Visit (INDEPENDENT_AMBULATORY_CARE_PROVIDER_SITE_OTHER): Payer: 59 | Admitting: Family Medicine

## 2021-08-10 VITALS — BP 112/71 | HR 83 | Ht 62.0 in | Wt 117.6 lb

## 2021-08-10 DIAGNOSIS — G8929 Other chronic pain: Secondary | ICD-10-CM | POA: Diagnosis not present

## 2021-08-10 DIAGNOSIS — M549 Dorsalgia, unspecified: Secondary | ICD-10-CM | POA: Diagnosis not present

## 2021-08-10 DIAGNOSIS — R5383 Other fatigue: Secondary | ICD-10-CM | POA: Diagnosis not present

## 2021-08-10 MED ORDER — GABAPENTIN 100 MG PO CAPS: 100.0000 mg | ORAL_CAPSULE | Freq: Three times a day (TID) | ORAL | 3 refills | Status: DC

## 2021-08-10 NOTE — Assessment & Plan Note (Addendum)
Patient has been evaluated several times now.  Pain is now somewhat changing and it is more present on the right side and radiates from her shoulder area down to her feet.  Origin the pain does not appear to be in the spine, does not correlate with neuropathic radiation but patient does report tingling sensation.  We have not ruled out anemia that can sometimes contribute to this tingling sensation. ?- CBC and ferritin ?- Gabapentin 100 nightly ?- Can continue baclofen and Tylenol as needed ?- If symptoms do not improve with interventions, we will need to consider sports medicine or physical therapy for further evaluation. ?

## 2021-08-10 NOTE — Progress Notes (Signed)
? ? ?  SUBJECTIVE:  ? ?CHIEF COMPLAINT / HPI:  ? ?[place] [phone] present for entire encounter. ? ?Patient reports that she is still having back pain.  She notes that the baclofen did help for the area that was initially hurting and that has since improved. ? ?She is now noticing that she has pain/pressure of this on her buttock.  She notes that since last Thursday is felt as if there is a squeezing sensation that is deeper.  She notes it starts in her upper back/shoulder and radiates down her entire right side all the way to the legs.  She tries massaging and using the medications but it does not help.  She describes the pain as a tingling and radiating pain sometimes it is sharp. ? ?PERTINENT  PMH / PSH: Reviewed ? ?OBJECTIVE:  ? ?BP 112/71   Pulse 83   Ht '5\' 2"'$  (1.575 m)   Wt 117 lb 9.6 oz (53.3 kg)   LMP 08/05/2021   SpO2 100%   BMI 21.51 kg/m?   ?General: NAD, well-appearing, well-nourished ?Respiratory: No respiratory distress, breathing comfortably, able to speak in full sentences ?Skin: warm and dry, no rashes noted on exposed skin ?Psych: Appropriate affect and mood ?MSK Back exam: ?- Inspection: no gross deformity or asymmetry, swelling or ecchymosis ?- Palpation: No TTP over the spinous processes, paraspinal muscles, or SI joints b/l ?- ROM: full active ROM of the lumbar spine in flexion and extension without pain ?- Strength: 5/5 strength of lower extremity in L4-S1 nerve root distributions b/l; normal gait ?- Neuro: sensation intact in the L4-S1 nerve root distribution b/l, 2+ L4 and S1 reflexes ?Right shoulder ?Mildly tender with deep manipulation of the muscles, does not illicit the radiating sensation described by patient.  ? ?ASSESSMENT/PLAN:  ? ?Chronic bilateral back pain  Mild fatigue ?Patient has been evaluated several times now.  Pain is now somewhat changing and it is more present on the right side and radiates from her shoulder area down to her feet.  Origin the  pain does not appear to be in the spine, does not correlate with neuropathic radiation but patient does report tingling sensation.  We have not ruled out anemia that can sometimes contribute to this tingling sensation. ?- CBC and ferritin ?- Gabapentin 100 nightly ?- Can continue baclofen and Tylenol as needed ?- If symptoms do not improve with interventions, we will need to consider sports medicine or physical therapy for further evaluation. ? ? ?Maria Schou, DO ?[place]  ?

## 2021-08-10 NOTE — Patient Instructions (Signed)
I am going to check some labs on you today to see if any signs of anemia could be contributing to some of your pain symptoms. ? ?I am sending in a medication called gabapentin, you can take this at night to see if it does help somewhat with your symptoms, it can make you a little bit sleepy so start with taking it at night and we can see if you can use it during the day as well. ? ?Continue to use the baclofen and Tylenol/ibuprofen as you need for the pain as well. ? ?If none of these measures work, then we may need to consider sending you to a sports medicine physician or to physical therapy. ?

## 2021-08-11 ENCOUNTER — Other Ambulatory Visit: Payer: Self-pay | Admitting: Family Medicine

## 2021-08-11 LAB — CBC
Hematocrit: 37.2 % (ref 34.0–46.6)
Hemoglobin: 12.6 g/dL (ref 11.1–15.9)
MCH: 33.2 pg — ABNORMAL HIGH (ref 26.6–33.0)
MCHC: 33.9 g/dL (ref 31.5–35.7)
MCV: 98 fL — ABNORMAL HIGH (ref 79–97)
Platelets: 239 10*3/uL (ref 150–450)
RBC: 3.8 x10E6/uL (ref 3.77–5.28)
RDW: 11.9 % (ref 11.7–15.4)
WBC: 8.5 10*3/uL (ref 3.4–10.8)

## 2021-08-11 LAB — FERRITIN: Ferritin: 80 ng/mL (ref 15–150)

## 2021-08-18 ENCOUNTER — Other Ambulatory Visit: Payer: Self-pay | Admitting: Family Medicine

## 2021-08-23 ENCOUNTER — Ambulatory Visit (INDEPENDENT_AMBULATORY_CARE_PROVIDER_SITE_OTHER): Payer: 59 | Admitting: Family Medicine

## 2021-08-23 ENCOUNTER — Encounter: Payer: Self-pay | Admitting: Family Medicine

## 2021-08-23 VITALS — BP 139/81 | HR 92 | Ht 62.0 in | Wt 119.0 lb

## 2021-08-23 DIAGNOSIS — M549 Dorsalgia, unspecified: Secondary | ICD-10-CM

## 2021-08-23 DIAGNOSIS — G8929 Other chronic pain: Secondary | ICD-10-CM

## 2021-08-23 DIAGNOSIS — E559 Vitamin D deficiency, unspecified: Secondary | ICD-10-CM

## 2021-08-23 DIAGNOSIS — Z1211 Encounter for screening for malignant neoplasm of colon: Secondary | ICD-10-CM | POA: Diagnosis not present

## 2021-08-23 DIAGNOSIS — Z0000 Encounter for general adult medical examination without abnormal findings: Secondary | ICD-10-CM | POA: Insufficient documentation

## 2021-08-23 DIAGNOSIS — N951 Menopausal and female climacteric states: Secondary | ICD-10-CM

## 2021-08-23 MED ORDER — VITAMIN D3 1.25 MG (50000 UT) PO CAPS: ORAL_CAPSULE | ORAL | 0 refills | Status: DC

## 2021-08-23 NOTE — Patient Instructions (Addendum)
It was so great seeing you today! Today we discussed the following: ? ?- we have sent in a referral to the GI doctor for a colonoscopy ? ?- I have placed a referral to sports medicine clinic to evaluate your back.  If your medications you are taking are not helping then you do not need to take them anymore ? ?Please make sure to bring any medications you take to your appointments. If you have any questions or concerns please call the office at [phone].  ? ?

## 2021-08-23 NOTE — Progress Notes (Signed)
? ? ?  SUBJECTIVE:  ? ?Chief compliant/HPI: annual examination ? ?Maria [name] is a 51 y.o. who presents today for an annual exam. Vietnamese video interpreter [phone] was present for most of the encounter. (Several interpreters had to be called due to the calls being dropped.) ? ?Review of systems form notable for hot flashes, patient reports that she still has periods but that they are becoming more irregular in their duration as well as their timing.  She also notes during her hot flashes sometimes having other symptoms such as tingling hands and difficulty with sleeping. ?Patient also notes that her back pain is starting to somewhat radiate down her leg. ? ?Updated history tabs and problem list.  ? ?OBJECTIVE:  ? ?BP 139/81   Pulse 92   Ht $R'5\' 2"'Mu$  (1.575 m)   Wt 119 lb (54 kg)   LMP 08/05/2021   SpO2 100%   BMI 21.77 kg/m?   ?General -- oriented x3, pleasant and cooperative. ?HEENT -- Head is normocephalic. PERRLA. EOMI. Ears, nose and throat were benign. ?Neck -- supple; no bruits. ?Integument -- intact. No rash, erythema, or ecchymoses.  ?Chest -- good expansion. Lungs clear to auscultation. ?Cardiac -- RRR. No murmurs noted.  ?Abdomen -- soft, nontender. No masses palpable. Bowel sounds present. ?Genital, rectal and breast exam -- deferred. ?CNS -- cranial nerves II through XII grossly intact. 2+ reflexes bilaterally. ?Extremities - no tenderness or effusions noted. ROM good. 5/5 bilateral strength. Dorsalis pedis pulses present and symmetrical.  Negative straight leg raise bilaterally. ? ? ?ASSESSMENT/PLAN:  ? ?Chronic bilateral back pain ?No improvement with gabapentin, notes radiating symptoms intermittently down her leg.  Patient would like to see sports medicine.  Symptoms not reproducible on examination today. ?- Referral placed to sports medicine for further evaluation ? ?Vitamin D deficiency ?Refilled vitamin D3 weekly. ?- Follow-up in 2 to 3 months to repeat lab ?  ? ?Annual Examination  ?See AVS for  age appropriate recommendations.  ? ?PHQ score 0, reviewed and discussed. ?Blood pressure reviewed and at goal.  ?Asked about intimate partner violence and patient reports no concerns.  ?Advanced directives dicussed, patient currently has none  ? ? ?Considered the following items based upon USPSTF recommendations: ?HIV testing: discussed, patient will consider ?Hepatitis C: discussed, patient will consider ?Hepatitis B: discussed, patient will consider ?Syphilis if at high risk: discussed ?GC/CT not at high risk and not ordered. ?Lipid panel (nonfasting or fasting) discussed based upon AHA recommendations and not ordered as was collected December 2022 ? ?-Discussed family history, BRCA testing not indicated. Tool used to risk stratify was Pedigree Assessment tool. ?-Last mammogram several years prior, patient encouraged to call breast center to schedule appointment ?-Cervical cancer screening: prior Pap reviewed, repeat due in 2025 ?-Immunizations: needing Shingrix and COVID booster, counseled patient on both, will defer for now.  ?-Due for colonoscopy: referral to GI made today ? ? ?Follow up in 1 year or sooner if indicated.  ? ? ?Madine Sarr, DO ?[place]  ? ?

## 2021-08-23 NOTE — Assessment & Plan Note (Signed)
No improvement with gabapentin, notes radiating symptoms intermittently down her leg.  Patient would like to see sports medicine.  Symptoms not reproducible on examination today. ?- Referral placed to sports medicine for further evaluation ?

## 2021-08-23 NOTE — Assessment & Plan Note (Signed)
Refilled vitamin D3 weekly. ?- Follow-up in 2 to 3 months to repeat lab ?

## 2021-08-26 ENCOUNTER — Encounter: Payer: Self-pay | Admitting: Family Medicine

## 2021-08-26 ENCOUNTER — Other Ambulatory Visit: Payer: Self-pay | Admitting: Family Medicine

## 2021-08-26 DIAGNOSIS — G8929 Other chronic pain: Secondary | ICD-10-CM

## 2021-08-30 ENCOUNTER — Other Ambulatory Visit: Payer: Self-pay | Admitting: Family Medicine

## 2021-09-25 ENCOUNTER — Ambulatory Visit: Payer: 59 | Attending: Family Medicine | Admitting: Rehabilitative and Restorative Service Providers"

## 2021-09-25 ENCOUNTER — Encounter: Payer: Self-pay | Admitting: Rehabilitative and Restorative Service Providers"

## 2021-09-25 DIAGNOSIS — M79605 Pain in left leg: Secondary | ICD-10-CM | POA: Insufficient documentation

## 2021-09-25 DIAGNOSIS — M5459 Other low back pain: Secondary | ICD-10-CM | POA: Diagnosis present

## 2021-09-25 DIAGNOSIS — M25511 Pain in right shoulder: Secondary | ICD-10-CM | POA: Insufficient documentation

## 2021-09-25 DIAGNOSIS — M25512 Pain in left shoulder: Secondary | ICD-10-CM | POA: Diagnosis present

## 2021-09-25 DIAGNOSIS — M549 Dorsalgia, unspecified: Secondary | ICD-10-CM | POA: Insufficient documentation

## 2021-09-25 DIAGNOSIS — M546 Pain in thoracic spine: Secondary | ICD-10-CM | POA: Insufficient documentation

## 2021-09-25 DIAGNOSIS — G8929 Other chronic pain: Secondary | ICD-10-CM | POA: Insufficient documentation

## 2021-09-25 NOTE — Therapy (Signed)
[facility] [place], [place], [address] Phone: [phone]   Fax:  [phone]  Patient Details  Name: Maria [name] MRN: [id number] Date of Birth: 07-08-1970 Referring Provider:  Rise Patience, DO  Encounter Date: 09/25/2021   OUTPATIENT PHYSICAL THERAPY THORACOLUMBAR EVALUATION   Patient Name: Maria [name] MRN: [id number] DOB:1970/05/11, 51 y.o., female Today's Date: 09/25/2021   PT End of Session - 09/25/21 1249     Visit Number 1    Number of Visits 10    Date for PT Re-Evaluation 11/06/21    Authorization Type [employer]    Progress Note Due on Visit 10    PT Start Time 1120    PT Stop Time 1242    PT Time Calculation (min) 82 min    Activity Tolerance Patient tolerated treatment well;No increased pain    Behavior During Therapy WFL for tasks assessed/performed             Past Medical History:  Diagnosis Date   Diabetes mellitus without complication ([place])    Hypertension    Past Surgical History:  Procedure Laterality Date   RHINOPLASTY     Patient Active Problem List   Diagnosis Date Noted   Chronic bilateral back pain 07/23/2021   Benign essential HTN 05/05/2020   Vitamin D deficiency 05/05/2020   Pure hypercholesterolemia 05/05/2020   Headache disorder 03/31/2017   Uterine leiomyoma 08/03/2016   Nocturia 05/23/2016   Urinary frequency 05/16/2016   Right thyroid nodule 03/28/2016    PCP: none on file  REFERRING PROVIDER: Alana Lilland, DO  REFERRING DIAG: M54.9,G89.29 (ICD-10-CM) - Chronic bilateral back pain, unspecified back location  Rationale for Evaluation and Treatment Rehabilitation  THERAPY DIAG:  Pain in thoracic spine  Other low back pain  Chronic left shoulder pain  Chronic right shoulder pain  Pain in left leg  ONSET DATE: initial onset date 6 years ago  SUBJECTIVE:                                                                                                                                                                                            SUBJECTIVE STATEMENT: Pain is in my neck, shoulder, back and leg. It is sharp and the left is worse than the right. Patient has been taking intermittent pain medication based off of what friends have told her to do.MD said if the meds are not helping, she should stop taking it. She saw meds on 5/1 but the dr is still refilling the medication so she just recently started taking the medication again for the last 3 days.  Shoulder pain feels "pokey" and leg feels achey and number from the knee to the back of the calf; she said it doesn't hurt to touch.   Interpreter Lyna on [employer] interpreted. PERTINENT HISTORY:  Started 5-6 years ago insidiously. Walking down the stairs, she received really bad back pain. Dr. Durward Fortes her to take meds and do certain exercises but she did not do exercises. Got worse 3 years ago again. Took meds and got better. 11/2020 had another flareup saw MD in [place] and dx her with disc herniation. Upper back pain due to age.   PAIN:  Are you having pain? Yes: NPRS scale: 5/10 Pain location: neck, shoulders, back, leg Pain description: constant Aggravating factors: standing Relieving factors: pain gets better than meds, walking , sitting   PRECAUTIONS: None  WEIGHT BEARING RESTRICTIONS No  FALLS:  Has patient fallen in last 6 months? 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   PLOF: Independent  PATIENT GOALS to have less pain   OBJECTIVE:   DIAGNOSTIC FINDINGS:  07/26/21 Xray: There is no evidence of lumbar spine fracture. Alignment is normal. Intervertebral disc spaces are maintained. 07/26/21 Xray: There is no evidence of lumbar spine fracture. Alignment is normal. Intervertebral disc spaces are maintained.   PATIENT SURVEYS: Unable to  perform based on language barrier   SCREENING FOR RED FLAGS: Bowel or bladder incontinence: No Spinal tumors: No Cauda equina syndrome: No Compression fracture: No Abdominal aneurysm: No  COGNITION:  Overall cognitive status: Within functional limits for tasks assessed     SENSATION: Light touch: pt reports diminished sensations along the L5 and S1 dermatome bil LEs    POSTURE:  good neutral spine posture sitting edge of chair, very flexible as she is showing the interpreter where she hurts; good neutral spine posture in standing; however, it was noted when sitting EOB, she was slumped momentarily and straightened herself up  PALPATION: R scapula lower, R thoracolumbar paraspinals tighter; R glute tighter; gluteal fold =; R ASIS lower and with outflare; R inferior patella pole higher; malleoli even   Cervical AROM: WFL all directions but R rotation limited 25% with stretch with all motions to thoracic spine  Shoulder AROM: WFL  LUMBAR AROM: WFL all directions    LOWER EXTREMITY A/PROM: WFL all directions bil LE     UPPER EXTREMITY MMT: 5/5 ALL  LOWER EXTREMITY MMT:  5/5 ALL   LUMBAR SPECIAL TESTS:  Straight leg raise test: Negative   GAIT: Comments: walks with upright posture  TODAY'S TREATMENT  09/25/21: see pt education section   PATIENT EDUCATION:  Education details: Discussed wearing shorts underneath skirt for treatments for her comfort, discussed discontinuing gym program until she can discuss what she does at the gym with PT (her husband wants her to lift heavy but her friends tell her to lift light). Pt told her even lifting light incorrectly can cause some of her symptoms. She agreed. Pt wants to know if she should take medication and how; PT advised her to speak with her MD. PT explained POC and what to expect for treatments.  Person educated: Patient and interpreter interpreted all Education method: Explanation Education comprehension: verbalized  understanding   HOME EXERCISE PROGRAM: Not issued at eval due to complexity of evaluation  ASSESSMENT:  CLINICAL IMPRESSION: Patient is a 51 y.o. female who was seen today for physical therapy evaluation  and treatment for back pain with shoulder/cervical and hip involvement. She demonstrates significant asymmetry of pelvis and R thoracolumbar/glute tightness. Pt demonstrates excellent strength of bil shoulder/LEs and good cervical/shoulder/lumbar/hip ROM. Pt would benefit from PT for correction/stabilization of pelvis and thoracolumbar flexibility exercises. She would benefit as well from core/cervical stabilization/flexibility exercises as well.    OBJECTIVE IMPAIRMENTS decreased activity tolerance, postural dysfunction, and pain.   ACTIVITY LIMITATIONS lifting, standing, caring for others, and work tasks  PARTICIPATION LIMITATIONS: cleaning, laundry, and community activity  [place] are also affecting patient's functional outcome.   REHAB POTENTIAL: Good  CLINICAL DECISION MAKING: Evolving/moderate complexity  EVALUATION COMPLEXITY: Moderate   GOALS: Goals reviewed with patient? Yes  SHORT TERM GOALS: Target date: 10/23/2021  Pt will be indep with HEP to assist with pain  management Baseline:not initiated at evaluation due to time constraint Goal status: INITIAL  2.  Pt will report improvement in L LE pain x 25% to assist with functional mobility Baseline: 0% constant Goal status: INITIAL  3.  Pt will be able to stand x 15 min with </= 3/10 LBP Baseline: up to 6/10 Goal status: INITIAL    LONG TERM GOALS: Target date: 11/06/21  Pt will be able to stand with 50% less LBP to perform functional activities Baseline: 0% Goal status: INITIAL  2.  Pt will be able to sit and perform nail tech duties with pain </=3/10 Baseline: up to 6/10, intermittent Goal status: INITIAL  3.  Pt will be able to perform gym program without increased shoulder, neck,  hip or LBP Baseline: has pain with activities; to share with therapist at next appt what exercises she does; up to 6/10 Goal status: INITIAL  4.  Pt will be indep with HEP to assist with lumbar/core/cervical stability to assist with back pain management Baseline: not issued at eval Goal status: INITIAL    PLAN: PT FREQUENCY: 1-2x/week  PT DURATION: 6 weeks  PLANNED INTERVENTIONS: Therapeutic exercises, Therapeutic activity, Neuromuscular re-education, Patient/Family education, Joint mobilization, Dry Needling, Moist heat, and Taping.  PLAN FOR NEXT SESSION: issue HEP, make sure onsite interpreter; manual therapy for pelvic asymmetry and core stabilization TE, have her tell therapist what exercises she does at the gym and PT can let her know whether or not to continue as it could be causing injury or pelvic imbalance; see palpation section   America Brown, PT 09/25/2021, 12:52 PM      [facility] [address] [place], [place], [address] Phone: [phone]   Fax:  [phone]

## 2021-09-28 ENCOUNTER — Other Ambulatory Visit: Payer: Self-pay | Admitting: Family Medicine

## 2021-09-29 NOTE — Therapy (Signed)
OUTPATIENT PHYSICAL THERAPY TREATMENT NOTE   Patient Name: Maria [name] MRN: [id number] DOB:04/22/1971, 51 y.o., female Today's Date: 09/30/2021  PCP: none on file REFERRING PROVIDER: Rise Patience, DO  END OF SESSION:   PT End of Session - 09/30/21 1009     Visit Number 2    Number of Visits 10    Date for PT Re-Evaluation 11/06/21    Authorization Type [employer]    Progress Note Due on Visit 10    PT Start Time 1008    PT Stop Time 1050    PT Time Calculation (min) 42 min    Activity Tolerance Patient tolerated treatment well;No increased pain    Behavior During Therapy [facility] for tasks assessed/performed             Past Medical History:  Diagnosis Date   Diabetes mellitus without complication ([place])    Hypertension    Past Surgical History:  Procedure Laterality Date   RHINOPLASTY     Patient Active Problem List   Diagnosis Date Noted   Chronic bilateral back pain 07/23/2021   Benign essential HTN 05/05/2020   Vitamin D deficiency 05/05/2020   Pure hypercholesterolemia 05/05/2020   Headache disorder 03/31/2017   Uterine leiomyoma 08/03/2016   Nocturia 05/23/2016   Urinary frequency 05/16/2016   Right thyroid nodule 03/28/2016    REFERRING DIAG: M54.9,G89.29 (ICD-10-CM) - Chronic bilateral back pain, unspecified back location  THERAPY DIAG:  Pain in thoracic spine  Other low back pain  Rationale for Evaluation and Treatment Rehabilitation  PERTINENT HISTORY: Started 5-6 years ago insidiously. Walking down the stairs, she received really bad back pain. Dr. Durward Fortes her to take meds and do certain exercises but she did not do exercises. Got worse 3 years ago again. Took meds and got better. 11/2020 had another flareup saw MD in [place] and dx her with disc herniation. Upper back pain due to age.   PRECAUTIONS: None  SUBJECTIVE: I feel normal but sometimes I feel achy.   PAIN:  Are you having pain? Yes: NPRS scale: 5/10 Pain location: neck,  shoulders, back, leg Pain description: constant Aggravating factors: standing Relieving factors: pain gets better than meds, walking , sitting   OBJECTIVE: (objective measures completed at initial evaluation unless otherwise dated)   DIAGNOSTIC FINDINGS:  07/26/21 Xray: There is no evidence of lumbar spine fracture. Alignment is normal. Intervertebral disc spaces are maintained. 07/26/21 Xray: There is no evidence of lumbar spine fracture. Alignment is normal. Intervertebral disc spaces are maintained.     PATIENT SURVEYS: Unable to perform based on language barrier     SCREENING FOR RED FLAGS: Bowel or bladder incontinence: No Spinal tumors: No Cauda equina syndrome: No Compression fracture: No Abdominal aneurysm: No   COGNITION:           Overall cognitive status: Within functional limits for tasks assessed                          SENSATION: Light touch: pt reports diminished sensations along the L5 and S1 dermatome bil LEs       POSTURE:  good neutral spine posture sitting edge of chair, very flexible as she is showing the interpreter where she hurts; good neutral spine posture in standing; however, it was noted when sitting EOB, she was slumped momentarily and straightened herself up   PALPATION: R scapula lower, R thoracolumbar paraspinals tighter; R glute tighter; gluteal fold =; R ASIS  lower and with outflare; R inferior patella pole higher; malleoli even    Cervical AROM: WFL all directions but R rotation limited 25% with stretch with all motions to thoracic spine   Shoulder AROM: WFL   LUMBAR AROM: WFL all directions     LOWER EXTREMITY A/PROM: WFL all directions bil LE                UPPER EXTREMITY MMT: 5/5 ALL   LOWER EXTREMITY MMT:  5/5 ALL     LUMBAR SPECIAL TESTS:  Straight leg raise test: Negative     GAIT: Comments: walks with upright posture   TODAY'S TREATMENT  [facility] Adult PT Treatment:                                                DATE:  09/30/2021 Therapeutic Exercise: Bridges 2x10 Supine 90/90 isometric hold 2x30" Supine 90/90 isometric hold with hand held resistance 2x30" LTR x10 BIL Open books x10 BIL Quadruped thoracic rotation with hand on back of head x10 BIL Bird dogs x10 BIL Rows GTB 2x10 Shoulder extension GTB 2x10 Squat to mat table 2x10 Pball roll outs forward x10 Supine sciatic nerve glides L x25  09/25/21: see pt education section     PATIENT EDUCATION:  Education details: Discussed wearing shorts underneath skirt for treatments for her comfort, discussed discontinuing gym program until she can discuss what she does at the gym with PT (her husband wants her to lift heavy but her friends tell her to lift light). Pt told her even lifting light incorrectly can cause some of her symptoms. She agreed. Pt wants to know if she should take medication and how; PT advised her to speak with her MD. PT explained POC and what to expect for treatments.  Person educated: Patient and interpreter interpreted all Education method: Explanation Education comprehension: verbalized understanding     HOME EXERCISE PROGRAM: Issued 09/30/2021 Access Code: [email or web address] URL: [email or web address] Date: 09/30/2021 Prepared by: Evelene Croon  Exercises - Supine Bridge  - 1 x daily - 7 x weekly - 3 sets - 10 reps - Supine Lower Trunk Rotation  - 1 x daily - 7 x weekly - 3 sets - 10 reps - Quadruped Thoracic Rotation Full Range with Hand on Neck  - 1 x daily - 7 x weekly - 3 sets - 10 reps   ASSESSMENT:   CLINICAL IMPRESSION: Patient presents to PT with mild to moderate pain in her upper back and lower back and reports radiating pain down her L leg. Created HEP and patient demonstrated understanding of each exercise. Session today focused on core and periscapular strengthening and lumbar and thoracic rotation. Patient was able to tolerate all prescribed exercises with no adverse effects. Patient continues to benefit  from skilled PT services and should be progressed as able to improve functional independence.      OBJECTIVE IMPAIRMENTS decreased activity tolerance, postural dysfunction, and pain.    ACTIVITY LIMITATIONS lifting, standing, caring for others, and work tasks   PARTICIPATION LIMITATIONS: cleaning, laundry, and community activity   [place] are also affecting patient's functional outcome.    REHAB POTENTIAL: Good   CLINICAL DECISION MAKING: Evolving/moderate complexity   EVALUATION COMPLEXITY: Moderate     GOALS: Goals reviewed with patient? Yes   SHORT TERM GOALS: Target date: 10/23/2021   Pt will be  indep with HEP to assist with pain  management Baseline:not initiated at evaluation due to time constraint Goal status: INITIAL   2.  Pt will report improvement in L LE pain x 25% to assist with functional mobility Baseline: 0% constant Goal status: INITIAL   3.  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  Pt will be able to perform gym program without increased shoulder, neck, hip or LBP Baseline: has pain with activities; to share with therapist at next appt what exercises she does; up to 6/10 Goal status: INITIAL   4.  Pt will be indep with HEP to assist with lumbar/core/cervical stability to assist with back pain management Baseline: not issued at eval Goal status: INITIAL       PLAN: PT FREQUENCY: 1-2x/week   PT DURATION: 6 weeks   PLANNED INTERVENTIONS: Therapeutic exercises, Therapeutic activity, Neuromuscular re-education, Patient/Family education, Joint mobilization, Dry Needling, Moist heat, and  Taping.   PLAN FOR NEXT SESSION: issue HEP, make sure onsite interpreter; manual therapy for pelvic asymmetry and core stabilization TE, have her tell therapist what exercises she does at the gym and PT can let her know whether or not to continue as it could be causing injury or pelvic imbalance; see palpation section   Evelene Croon, PTA 09/30/2021, 10:52 AM

## 2021-09-30 ENCOUNTER — Ambulatory Visit: Payer: 59

## 2021-09-30 DIAGNOSIS — M5459 Other low back pain: Secondary | ICD-10-CM

## 2021-09-30 DIAGNOSIS — M546 Pain in thoracic spine: Secondary | ICD-10-CM | POA: Diagnosis not present

## 2021-10-04 ENCOUNTER — Ambulatory Visit: Payer: 59

## 2021-10-04 DIAGNOSIS — G8929 Other chronic pain: Secondary | ICD-10-CM

## 2021-10-04 DIAGNOSIS — M546 Pain in thoracic spine: Secondary | ICD-10-CM | POA: Diagnosis not present

## 2021-10-04 DIAGNOSIS — M5459 Other low back pain: Secondary | ICD-10-CM

## 2021-10-04 NOTE — Therapy (Signed)
OUTPATIENT PHYSICAL THERAPY TREATMENT NOTE   Patient Name: SHAREL BEHNE MRN: [id number] DOB:[birth year], 51 y.o., female Today's Date: 10/04/2021  PCP: none on file REFERRING PROVIDER: Rise Patience, DO  END OF SESSION:   PT End of Session - 10/04/21 1104     Visit Number 3    Number of Visits 10    Date for PT Re-Evaluation 11/06/21    Authorization Type [employer]    Progress Note Due on Visit 10    PT Start Time 1052    PT Stop Time 1130    PT Time Calculation (min) 38 min    Activity Tolerance Patient tolerated treatment well;No increased pain    Behavior During Therapy [facility] for tasks assessed/performed              Past Medical History:  Diagnosis Date   Diabetes mellitus without complication ([place])    Hypertension    Past Surgical History:  Procedure Laterality Date   RHINOPLASTY     Patient Active Problem List   Diagnosis Date Noted   Chronic bilateral back pain 07/23/2021   Benign essential HTN 05/05/2020   Vitamin D deficiency 05/05/2020   Pure hypercholesterolemia 05/05/2020   Headache disorder 03/31/2017   Uterine leiomyoma 08/03/2016   Nocturia 05/23/2016   Urinary frequency 05/16/2016   Right thyroid nodule 03/28/2016    REFERRING DIAG: M54.9,G89.29 (ICD-10-CM) - Chronic bilateral back pain, unspecified back location  THERAPY DIAG:  Pain in thoracic spine  Other low back pain  Chronic left shoulder pain  Chronic right shoulder pain  Rationale for Evaluation and Treatment Rehabilitation  PERTINENT HISTORY: Started 5-6 years ago insidiously. Walking down the stairs, she received really bad back pain. Dr. Durward Fortes her to take meds and do certain exercises but she did not do exercises. Got worse 3 years ago again. Took meds and got better. 11/2020 had another flareup saw MD in [place] and dx her with disc herniation. Upper back pain due to age.   PRECAUTIONS: None  SUBJECTIVE: Reports low back pain 5/10 today.  Also citing lateral thigh  symptoms as well as upper back discomfort.  Difficulty identifying aggravating/relieving factors due to language barrier.  PAIN:  Are you having pain? Yes: NPRS scale: 5/10 Pain location: neck, shoulders, back, leg Pain description: constant Aggravating factors: standing Relieving factors: pain gets better than meds, walking , sitting   OBJECTIVE: (objective measures completed at initial evaluation unless otherwise dated)   DIAGNOSTIC FINDINGS:  07/26/21 Xray: There is no evidence of lumbar spine fracture. Alignment is normal. Intervertebral disc spaces are maintained. 07/26/21 Xray: There is no evidence of lumbar spine fracture. Alignment is normal. Intervertebral disc spaces are maintained.     PATIENT SURVEYS: Unable to perform based on language barrier     SCREENING FOR RED FLAGS: Bowel or bladder incontinence: No Spinal tumors: No Cauda equina syndrome: No Compression fracture: No Abdominal aneurysm: No   COGNITION:           Overall cognitive status: Within functional limits for tasks assessed                          SENSATION: Light touch: pt reports diminished sensations along the L5 and S1 dermatome bil LEs       POSTURE:  good neutral spine posture sitting edge of chair, very flexible as she is showing the interpreter where she hurts; good neutral spine posture in standing; however, it was noted  when sitting EOB, she was slumped momentarily and straightened herself up   PALPATION: R scapula lower, R thoracolumbar paraspinals tighter; R glute tighter; gluteal fold =; R ASIS lower and with outflare; R inferior patella pole higher; malleoli even    Cervical AROM: WFL all directions but R rotation limited 25% with stretch with all motions to thoracic spine   Shoulder AROM: WFL   LUMBAR AROM: WFL all directions     LOWER EXTREMITY A/PROM: WFL all directions bil LE                UPPER EXTREMITY MMT: 5/5 ALL   LOWER EXTREMITY MMT:  5/5 ALL     LUMBAR SPECIAL  TESTS:  Straight leg raise test: Negative     GAIT: Comments: walks with upright posture   TODAY'S TREATMENT  [facility] Adult PT Treatment:                                                DATE: 10/04/21 Therapeutic Exercise: Nustep L4 6 min SKTC 30sx2 B Cross body piriformis stretch 30s x2 B Curl ups 15x Bridge 15x SL bridge 15/15 Hips/knees 90/90 30s x2 Clams 15/15 Open book 10/10 Supine HOR ABD RTB 15x Quadruped thoracic rotation 15/15(hand b/h head)  [facility] Adult PT Treatment:                                                DATE: 09/30/2021 Therapeutic Exercise: Bridges 2x10 Supine 90/90 isometric hold 2x30" Supine 90/90 isometric hold with hand held resistance 2x30" LTR x10 BIL Open books x10 BIL Quadruped thoracic rotation with hand on back of head x10 BIL Bird dogs x10 BIL Rows GTB 2x10 Shoulder extension GTB 2x10 Squat to mat table 2x10 Pball roll outs forward x10 Supine sciatic nerve glides L x25  09/25/21: see pt education section     PATIENT EDUCATION:  Education details: Discussed wearing shorts underneath skirt for treatments for her comfort, discussed discontinuing gym program until she can discuss what she does at the gym with PT (her husband wants her to lift heavy but her friends tell her to lift light). Pt told her even lifting light incorrectly can cause some of her symptoms. She agreed. Pt wants to know if she should take medication and how; PT advised her to speak with her MD. PT explained POC and what to expect for treatments.  Person educated: Patient and interpreter interpreted all Education method: Explanation Education comprehension: verbalized understanding     HOME EXERCISE PROGRAM: Issued 09/30/2021 Access Code: [email or web address] URL: [email or web address] Date: 09/30/2021 Prepared by: Evelene Croon  Exercises - Supine Bridge  - 1 x daily - 7 x weekly - 3 sets - 10 reps - Supine Lower Trunk Rotation  - 1 x daily - 7 x weekly - 3 sets - 10  reps - Quadruped Thoracic Rotation Full Range with Hand on Neck  - 1 x daily - 7 x weekly - 3 sets - 10 reps   ASSESSMENT:   CLINICAL IMPRESSION: Pain levels remaining at 5/10.  Some difficulty obtaining subjective info due to language barrier and medical literacy.  Began aerobic work prior to Engineer, maintenance (IT).  Continued with stretching and focused on core strengthening.  Increased difficulty and challenge as noted. Showing good flexibility through hips and lumbar spine but core weakness evident when challenged.  Discussed 10# lifting restriction and recommended cardio work at gym.     OBJECTIVE IMPAIRMENTS decreased activity tolerance, postural dysfunction, and pain.    ACTIVITY LIMITATIONS lifting, standing, caring for others, and work tasks   PARTICIPATION LIMITATIONS: cleaning, laundry, and community activity   [place] are also affecting patient's functional outcome.    REHAB POTENTIAL: Good   CLINICAL DECISION MAKING: Evolving/moderate complexity   EVALUATION COMPLEXITY: Moderate     GOALS: Goals reviewed with patient? Yes   SHORT TERM GOALS: Target date: 10/23/2021   Pt will be indep with HEP to assist with pain  management Baseline:not initiated at evaluation due to time constraint Goal status: INITIAL   2.  Pt will report improvement in L LE pain x 25% to assist with functional mobility Baseline: 0% constant Goal status: INITIAL   3.  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  Pt will be able to perform gym program without increased shoulder, neck, hip or LBP Baseline: has pain with  activities; to share with therapist at next appt what exercises she does; up to 6/10 Goal status: INITIAL   4.  Pt will be indep with HEP to assist with lumbar/core/cervical stability to assist with back pain management Baseline: not issued at eval Goal status: INITIAL       PLAN: PT FREQUENCY: 1-2x/week   PT DURATION: 6 weeks   PLANNED INTERVENTIONS: Therapeutic exercises, Therapeutic activity, Neuromuscular re-education, Patient/Family education, Joint mobilization, Dry Needling, Moist heat, and Taping.   PLAN FOR NEXT SESSION: issue HEP, make sure onsite interpreter; manual therapy for pelvic asymmetry and core stabilization TE, have her tell therapist what exercises she does at the gym and PT can let her know whether or not to continue as it could be causing injury or pelvic imbalance; see palpation section, core strengthening   Lanice Shirts, PT 10/04/2021, 11:52 AM

## 2021-10-06 ENCOUNTER — Ambulatory Visit: Payer: 59

## 2021-10-06 DIAGNOSIS — M5459 Other low back pain: Secondary | ICD-10-CM

## 2021-10-06 DIAGNOSIS — M546 Pain in thoracic spine: Secondary | ICD-10-CM

## 2021-10-06 NOTE — Therapy (Signed)
OUTPATIENT PHYSICAL THERAPY TREATMENT NOTE   Patient Name: Maria [name] MRN: [id number] DOB:10-Aug-1970, 51 y.o., female Today's Date: 10/06/2021  PCP: none on file REFERRING PROVIDER: Rise Patience, DO  END OF SESSION:   PT End of Session - 10/06/21 1220     Visit Number 4    Number of Visits 10    Date for PT Re-Evaluation 11/06/21    Authorization Type [employer]    Progress Note Due on Visit 10    PT Start Time 1215    PT Stop Time 1255    PT Time Calculation (min) 40 min    Activity Tolerance Patient tolerated treatment well;No increased pain    Behavior During Therapy [facility] for tasks assessed/performed               Past Medical History:  Diagnosis Date   Diabetes mellitus without complication ([place])    Hypertension    Past Surgical History:  Procedure Laterality Date   RHINOPLASTY     Patient Active Problem List   Diagnosis Date Noted   Chronic bilateral back pain 07/23/2021   Benign essential HTN 05/05/2020   Vitamin D deficiency 05/05/2020   Pure hypercholesterolemia 05/05/2020   Headache disorder 03/31/2017   Uterine leiomyoma 08/03/2016   Nocturia 05/23/2016   Urinary frequency 05/16/2016   Right thyroid nodule 03/28/2016    REFERRING DIAG: M54.9,G89.29 (ICD-10-CM) - Chronic bilateral back pain, unspecified back location  THERAPY DIAG:  Pain in thoracic spine  Other low back pain  Rationale for Evaluation and Treatment Rehabilitation  PERTINENT HISTORY: Started 5-6 years ago insidiously. Walking down the stairs, she received really bad back pain. Dr. Durward Fortes her to take meds and do certain exercises but she did not do exercises. Got worse 3 years ago again. Took meds and got better. 11/2020 had another flareup saw MD in [place] and dx her with disc herniation. Upper back pain due to age.   PRECAUTIONS: None  SUBJECTIVE: Continues with diffuse upper and lower back symptoms.  PAIN:  Are you having pain? Yes: NPRS scale: 5/10 Pain  location: neck, shoulders, back, leg Pain description: constant Aggravating factors: standing Relieving factors: pain gets better than meds, walking , sitting   OBJECTIVE: (objective measures completed at initial evaluation unless otherwise dated)   DIAGNOSTIC FINDINGS:  07/26/21 Xray: There is no evidence of lumbar spine fracture. Alignment is normal. Intervertebral disc spaces are maintained. 07/26/21 Xray: There is no evidence of lumbar spine fracture. Alignment is normal. Intervertebral disc spaces are maintained.     PATIENT SURVEYS: Unable to perform based on language barrier     SCREENING FOR RED FLAGS: Bowel or bladder incontinence: No Spinal tumors: No Cauda equina syndrome: No Compression fracture: No Abdominal aneurysm: No   COGNITION:           Overall cognitive status: Within functional limits for tasks assessed                          SENSATION: Light touch: pt reports diminished sensations along the L5 and S1 dermatome bil LEs       POSTURE:  good neutral spine posture sitting edge of chair, very flexible as she is showing the interpreter where she hurts; good neutral spine posture in standing; however, it was noted when sitting EOB, she was slumped momentarily and straightened herself up   PALPATION: R scapula lower, R thoracolumbar paraspinals tighter; R glute tighter; gluteal fold =; R  ASIS lower and with outflare; R inferior patella pole higher; malleoli even    Cervical AROM: WFL all directions but R rotation limited 25% with stretch with all motions to thoracic spine   Shoulder AROM: WFL   LUMBAR AROM: WFL all directions     LOWER EXTREMITY A/PROM: WFL all directions bil LE                UPPER EXTREMITY MMT: 5/5 ALL   LOWER EXTREMITY MMT:  5/5 ALL     LUMBAR SPECIAL TESTS:  Straight leg raise test: Negative     GAIT: Comments: walks with upright posture   TODAY'S TREATMENT  [facility] Adult PT Treatment:                                                 DATE: 10/06/21 Therapeutic Exercise: Nustep L2 8 min SKTC 30sx2 B Cross body piriformis stretch 30s x2 B Curl ups 20x Bridge w/ball squeeze 20x SL bridge 20/20 Hips/knees 90/90 30s x2 Supine hip fallouts RTB 20x Clams 20/20 RTB Open book 10/10 Supine HOR ABD RTB 20x Quadruped thoracic rotation 15/15(hand b/h head)  [facility] Adult PT Treatment:                                                DATE: 10/04/21 Therapeutic Exercise: Nustep L4 6 min SKTC 30sx2 B Cross body piriformis stretch 30s x2 B Curl ups 15x Bridge 15x SL bridge 15/15 Hips/knees 90/90 30s x2 Clams 15/15 Open book 10/10 Supine HOR ABD RTB 15x Quadruped thoracic rotation 15/15(hand b/h head)  [facility] Adult PT Treatment:                                                DATE: 09/30/2021 Therapeutic Exercise: Bridges 2x10 Supine 90/90 isometric hold 2x30" Supine 90/90 isometric hold with hand held resistance 2x30" LTR x10 BIL Open books x10 BIL Quadruped thoracic rotation with hand on back of head x10 BIL Bird dogs x10 BIL Rows GTB 2x10 Shoulder extension GTB 2x10 Squat to mat table 2x10 Pball roll outs forward x10 Supine sciatic nerve glides L x25  09/25/21: see pt education section     PATIENT EDUCATION:  Education details: Discussed wearing shorts underneath skirt for treatments for her comfort, discussed discontinuing gym program until she can discuss what she does at the gym with PT (her husband wants her to lift heavy but her friends tell her to lift light). Pt told her even lifting light incorrectly can cause some of her symptoms. She agreed. Pt wants to know if she should take medication and how; PT advised her to speak with her MD. PT explained POC and what to expect for treatments.  Person educated: Patient and interpreter interpreted all Education method: Explanation Education comprehension: verbalized understanding     HOME EXERCISE PROGRAM: Issued 09/30/2021 Access Code: [email or web address] URL:  [email or web address] Date: 09/30/2021 Prepared by: Evelene Croon  Exercises - Supine Bridge  - 1 x daily - 7 x weekly - 3 sets - 10 reps - Supine Lower Trunk Rotation  - 1 x daily -  7 x weekly - 3 sets - 10 reps - Quadruped Thoracic Rotation Full Range with Hand on Neck  - 1 x daily - 7 x weekly - 3 sets - 10 reps   ASSESSMENT:   CLINICAL IMPRESSION: Increased time on aerobic task but decreased resistance to emphasize endurance.  Increased reps to continue strengthening.  Patient presenting with good mobility throughout, able to complete all tasks with good pace and no hesitancy/apprehension towards movement.     OBJECTIVE IMPAIRMENTS decreased activity tolerance, postural dysfunction, and pain.    ACTIVITY LIMITATIONS lifting, standing, caring for others, and work tasks   PARTICIPATION LIMITATIONS: cleaning, laundry, and community activity   [place] are also affecting patient's functional outcome.    REHAB POTENTIAL: Good   CLINICAL DECISION MAKING: Evolving/moderate complexity   EVALUATION COMPLEXITY: Moderate     GOALS: Goals reviewed with patient? Yes   SHORT TERM GOALS: Target date: 10/23/2021   Pt will be indep with HEP to assist with pain  management Baseline:not initiated at evaluation due to time constraint Goal status: INITIAL   2.  Pt will report improvement in L LE pain x 25% to assist with functional mobility Baseline: 0% constant Goal status: INITIAL   3.  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  Pt will be able to perform gym program without increased shoulder, neck,  hip or LBP Baseline: has pain with activities; to share with therapist at next appt what exercises she does; up to 6/10 Goal status: INITIAL   4.  Pt will be indep with HEP to assist with lumbar/core/cervical stability to assist with back pain management Baseline: not issued at eval Goal status: INITIAL       PLAN: PT FREQUENCY: 1-2x/week   PT DURATION: 6 weeks   PLANNED INTERVENTIONS: Therapeutic exercises, Therapeutic activity, Neuromuscular re-education, Patient/Family education, Joint mobilization, Dry Needling, Moist heat, and Taping.   PLAN FOR NEXT SESSION: issue HEP, make sure onsite interpreter; manual therapy for pelvic asymmetry and core stabilization TE, have her tell therapist what exercises she does at the gym and PT can let her know whether or not to continue as it could be causing injury or pelvic imbalance; see palpation section, core strengthening   Lanice Shirts, PT 10/06/2021, 12:21 PM

## 2021-10-11 ENCOUNTER — Ambulatory Visit: Payer: 59

## 2021-10-11 DIAGNOSIS — M5459 Other low back pain: Secondary | ICD-10-CM

## 2021-10-11 DIAGNOSIS — M546 Pain in thoracic spine: Secondary | ICD-10-CM

## 2021-10-11 NOTE — Therapy (Signed)
OUTPATIENT PHYSICAL THERAPY TREATMENT NOTE   Patient Name: Maria [name] MRN: [id number] DOB:[birth year], 51 y.o., female Today's Date: 10/11/2021  PCP: none on file REFERRING PROVIDER: Rise Patience, DO  END OF SESSION:   PT End of Session - 10/11/21 1008     Visit Number 5    Number of Visits 10    Date for PT Re-Evaluation 11/06/21    Authorization Type [employer]    Progress Note Due on Visit 10    PT Start Time 1005    PT Stop Time 1043    PT Time Calculation (min) 38 min    Activity Tolerance Patient tolerated treatment well;No increased pain    Behavior During Therapy [facility] for tasks assessed/performed                Past Medical History:  Diagnosis Date   Diabetes mellitus without complication ([place])    Hypertension    Past Surgical History:  Procedure Laterality Date   RHINOPLASTY     Patient Active Problem List   Diagnosis Date Noted   Chronic bilateral back pain 07/23/2021   Benign essential HTN 05/05/2020   Vitamin D deficiency 05/05/2020   Pure hypercholesterolemia 05/05/2020   Headache disorder 03/31/2017   Uterine leiomyoma 08/03/2016   Nocturia 05/23/2016   Urinary frequency 05/16/2016   Right thyroid nodule 03/28/2016    REFERRING DIAG: M54.9,G89.29 (ICD-10-CM) - Chronic bilateral back pain, unspecified back location  THERAPY DIAG:  Pain in thoracic spine  Other low back pain  Rationale for Evaluation and Treatment Rehabilitation  PERTINENT HISTORY: Started 5-6 years ago insidiously. Walking down the stairs, she received really bad back pain. Dr. Durward Fortes her to take meds and do certain exercises but she did not do exercises. Got worse 3 years ago again. Took meds and got better. 11/2020 had another flareup saw MD in [place] and dx her with disc herniation. Upper back pain due to age.   PRECAUTIONS: None  SUBJECTIVE: Pt presents to PT with continued lower back and L LE pain. Has been compliant with her HEP with no adverse effect. Pt  is ready to begin PT at this time.   PAIN:  Are you having pain?  Yes: NPRS scale: 5/10 Pain location: lower back, L LE Pain description: constant Aggravating factors: standing Relieving factors: pain gets better than meds, walking , sitting   OBJECTIVE: (objective measures completed at initial evaluation unless otherwise dated)  COGNITION:           Overall cognitive status: Within functional limits for tasks assessed                          SENSATION: Light touch: pt reports diminished sensations along the L5 and S1 dermatome bil LEs   PALPATION: R scapula lower, R thoracolumbar paraspinals tighter; R glute tighter; gluteal fold =; R ASIS lower and with outflare; R inferior patella pole higher; malleoli even    Cervical AROM: WFL all directions but R rotation limited 25% with stretch with all motions to thoracic spine   Shoulder AROM: WFL   LUMBAR AROM: WFL all directions     TODAY'S TREATMENT  [facility] Adult PT Treatment:  DATE: 10/11/2021 Therapeutic Exercise: Quadruped thoracic rotation x 10 - with soft foam Cat-Cow x 10 Supine horizontal abd 2x15 GTB Bridge 2x15 - 2nd set with pulse Table top 90/90 hold x 30" 90/90 hundreds 2x20"  90/90 heel tap x 10  Deadbug 2x10 S/L clamshell x 20 GTB each Bird dog 2x10  Seated bilateral ER with scapular retraction 2x15 GTB Supine sciatic nerve glide L x 20 Cross body piriformis stretch 2x30" each  [facility] Adult PT Treatment:                                                DATE: 10/06/2021 Therapeutic Exercise: Nustep L2 8 min SKTC 30sx2 B Cross body piriformis stretch 30s x2 B Curl ups 20x Bridge w/ball squeeze 20x SL bridge 20/20 Hips/knees 90/90 30s x2 Supine hip fallouts RTB 20x Clams 20/20 RTB Open book 10/10 Supine HOR ABD RTB 20x Quadruped thoracic rotation 15/15(hand b/h head)  [facility] Adult PT Treatment:                                                DATE:  10/04/2021 Therapeutic Exercise: Nustep L4 6 min SKTC 30sx2 B Cross body piriformis stretch 30s x2 B Curl ups 15x Bridge 15x SL bridge 15/15 Hips/knees 90/90 30s x2 Clams 15/15 Open book 10/10 Supine HOR ABD RTB 15x Quadruped thoracic rotation 15/15(hand b/h head)  PATIENT EDUCATION:  Education details: Discussed wearing shorts underneath skirt for treatments for her comfort, discussed discontinuing gym program until she can discuss what she does at the gym with PT (her husband wants her to lift heavy but her friends tell her to lift light). Pt told her even lifting light incorrectly can cause some of her symptoms. She agreed. Pt wants to know if she should take medication and how; PT advised her to speak with her MD. PT explained POC and what to expect for treatments.  Person educated: Patient and interpreter interpreted all Education method: Explanation Education comprehension: verbalized understanding     HOME EXERCISE PROGRAM: Issued 09/30/2021 Access Code: [email or web address] URL: [email or web address] Date: 09/30/2021 Prepared by: Evelene Croon  Exercises - Supine Bridge  - 1 x daily - 7 x weekly - 3 sets - 10 reps - Supine Lower Trunk Rotation  - 1 x daily - 7 x weekly - 3 sets - 10 reps - Quadruped Thoracic Rotation Full Range with Hand on Neck  - 1 x daily - 7 x weekly - 3 sets - 10 reps   ASSESSMENT:   CLINICAL IMPRESSION: Pt was able to complete all prescribed exercises with no adverse effect or increase in pain. Therapy today continued to work on thoracic mobility and core/periscapular strengthening in order to decrease pain and improve functional ability. Pt continues to benefit from skilled PT and will continue to be seen and progressed per POC.      OBJECTIVE IMPAIRMENTS decreased activity tolerance, postural dysfunction, and pain.    ACTIVITY LIMITATIONS lifting, standing, caring for others, and work tasks   PARTICIPATION LIMITATIONS: cleaning, laundry,  and community activity    GOALS: Goals reviewed with patient? Yes   SHORT TERM GOALS: Target date: 10/23/2021   Pt will be indep with HEP to assist with pain  management  Baseline:not initiated at evaluation due to time constraint Goal status: INITIAL   2.  Pt will report improvement in L LE pain x 25% to assist with functional mobility Baseline: 0% constant Goal status: INITIAL   3.  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  Pt will be able to perform gym program without increased shoulder, neck, hip or LBP Baseline: has pain with activities; to share with therapist at next appt what exercises she does; up to 6/10 Goal status: INITIAL   4.  Pt will be indep with HEP to assist with lumbar/core/cervical stability to assist with back pain management Baseline: not issued at eval Goal status: INITIAL       PLAN: PT FREQUENCY: 1-2x/week   PT DURATION: 6 weeks   PLANNED INTERVENTIONS: Therapeutic exercises, Therapeutic activity, Neuromuscular re-education, Patient/Family education, Joint mobilization, Dry Needling, Moist heat, and Taping.   PLAN FOR NEXT SESSION: issue HEP, make sure onsite interpreter; manual therapy for pelvic asymmetry and core stabilization TE, have her tell therapist what exercises she does at the gym and PT can let her know whether or not to continue as it could be causing injury or pelvic imbalance; see palpation section, core strengthening   Ward Chatters, PT 10/11/2021, 10:43 AM

## 2021-10-13 ENCOUNTER — Ambulatory Visit: Payer: 59

## 2021-10-13 DIAGNOSIS — M546 Pain in thoracic spine: Secondary | ICD-10-CM | POA: Diagnosis not present

## 2021-10-13 DIAGNOSIS — M5459 Other low back pain: Secondary | ICD-10-CM

## 2021-10-13 NOTE — Therapy (Signed)
OUTPATIENT PHYSICAL THERAPY TREATMENT NOTE   Patient Name: Maria [name] MRN: [id number] DOB:25-Dec-1970, 51 y.o., female Today's Date: 10/13/2021  PCP: none on file REFERRING PROVIDER: Rise Patience, DO  END OF SESSION:   PT End of Session - 10/13/21 1050     Visit Number 6    Number of Visits 10    Date for PT Re-Evaluation 11/06/21    Authorization Type [employer]    Progress Note Due on Visit 10    PT Start Time 3536    PT Stop Time 1129    PT Time Calculation (min) 38 min    Activity Tolerance Patient tolerated treatment well;No increased pain    Behavior During Therapy [facility] for tasks assessed/performed                 Past Medical History:  Diagnosis Date   Diabetes mellitus without complication ([place])    Hypertension    Past Surgical History:  Procedure Laterality Date   RHINOPLASTY     Patient Active Problem List   Diagnosis Date Noted   Chronic bilateral back pain 07/23/2021   Benign essential HTN 05/05/2020   Vitamin D deficiency 05/05/2020   Pure hypercholesterolemia 05/05/2020   Headache disorder 03/31/2017   Uterine leiomyoma 08/03/2016   Nocturia 05/23/2016   Urinary frequency 05/16/2016   Right thyroid nodule 03/28/2016    REFERRING DIAG: M54.9,G89.29 (ICD-10-CM) - Chronic bilateral back pain, unspecified back location  THERAPY DIAG:  Pain in thoracic spine  Other low back pain  Rationale for Evaluation and Treatment Rehabilitation  PERTINENT HISTORY: Started 5-6 years ago insidiously. Walking down the stairs, she received really bad back pain. Dr. Durward Fortes her to take meds and do certain exercises but she did not do exercises. Got worse 3 years ago again. Took meds and got better. 11/2020 had another flareup saw MD in [place] and dx her with disc herniation. Upper back pain due to age.   PRECAUTIONS: None  SUBJECTIVE:  Pt presents to PT with continued lower back pain. Has been compliant with HEP with no adverse effect. Pt is ready  to begin PT at this time.   PAIN:  Are you having pain?  Yes: NPRS scale: 5/10 Pain location: lower back, L LE Pain description: constant Aggravating factors: standing Relieving factors: pain gets better than meds, walking , sitting   OBJECTIVE: (objective measures completed at initial evaluation unless otherwise dated)  TODAY'S TREATMENT  [facility] Adult PT Treatment:                                                DATE: 10/13/2021 Therapeutic Exercise: Quadruped thoracic rotation x 10 - with soft foam Hooklying thoracic ext over soft foam x 15 Cat-Cow x 15 Supine horizontal abd 2x15 GTB Bridge 2x15 Table top 90/90 hold 2x30" 90/90 hundreds 2x15  Deadbug 2x10 S/L clamshell x 20 GTB each Bird dog 2x10  Seated bilateral ER with scapular retraction 2x15 GTB Standing row 2x10 17# Pallof pres 2x10 7# Supine piriformis stretch 2x30" each  [facility] Adult PT Treatment:                                                DATE: 10/11/2021 Therapeutic  Exercise: Quadruped thoracic rotation x 10 - with soft foam Cat-Cow x 10 Supine horizontal abd 2x15 GTB Bridge 2x15 - 2nd set with pulse Table top 90/90 hold x 30" 90/90 hundreds 2x20"  90/90 heel tap x 10  Deadbug 2x10 S/L clamshell x 20 GTB each Bird dog 2x10  Seated bilateral ER with scapular retraction 2x15 GTB Supine sciatic nerve glide L x 20 Cross body piriformis stretch 2x30" each  [facility] Adult PT Treatment:                                                DATE: 10/06/2021 Therapeutic Exercise: Nustep L2 8 min SKTC 30sx2 B Cross body piriformis stretch 30s x2 B Curl ups 20x Bridge w/ball squeeze 20x SL bridge 20/20 Hips/knees 90/90 30s x2 Supine hip fallouts RTB 20x Clams 20/20 RTB Open book 10/10 Supine HOR ABD RTB 20x Quadruped thoracic rotation 15/15(hand b/h head)   PATIENT EDUCATION:  Education details: continue HEP Person educated: Patient and interpreter interpreted all Education method: Explanation Education  comprehension: verbalized understanding     HOME EXERCISE PROGRAM: Issued 09/30/2021 Access Code: [email or web address] URL: [email or web address] Date: 09/30/2021 Prepared by: Evelene Croon  Exercises - Supine Bridge  - 1 x daily - 7 x weekly - 3 sets - 10 reps - Supine Lower Trunk Rotation  - 1 x daily - 7 x weekly - 3 sets - 10 reps - Quadruped Thoracic Rotation Full Range with Hand on Neck  - 1 x daily - 7 x weekly - 3 sets - 10 reps   ASSESSMENT:   CLINICAL IMPRESSION: Pt was again able to complete all prescribed exercises with no adverse effect or increase in pain. Therapy focused on continued core and periscapular strength as well as improving thoracic mobility in order to decrease pain and improve function. She continues to progress well with therapy showing improving strength and activity tolerance. Will continue to progress per POC as prescribed.      OBJECTIVE IMPAIRMENTS decreased activity tolerance, postural dysfunction, and pain.    ACTIVITY LIMITATIONS lifting, standing, caring for others, and work tasks   PARTICIPATION LIMITATIONS: cleaning, laundry, and community activity    GOALS: Goals reviewed with patient? Yes   SHORT TERM GOALS: Target date: 10/23/2021   Pt will be indep with HEP to assist with pain  management Baseline:not initiated at evaluation due to time constraint Goal status: INITIAL   2.  Pt will report improvement in L LE pain x 25% to assist with functional mobility Baseline: 0% constant Goal status: INITIAL   3.  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  Pt will be able to perform gym  program without increased shoulder, neck, hip or LBP Baseline: has pain with activities; to share with therapist at next appt what exercises she does; up to 6/10 Goal status: INITIAL   4.  Pt will be indep with HEP to assist with lumbar/core/cervical stability to  assist with back pain management Baseline: not issued at eval Goal status: INITIAL       PLAN: PT FREQUENCY: 1-2x/week   PT DURATION: 6 weeks   PLANNED INTERVENTIONS: Therapeutic exercises, Therapeutic activity, Neuromuscular re-education, Patient/Family education, Joint mobilization, Dry Needling, Moist heat, and Taping.   PLAN FOR NEXT SESSION: issue HEP, make sure onsite interpreter; manual therapy for pelvic asymmetry and core stabilization TE, have her tell therapist what exercises she does at the gym and PT can let her know whether or not to continue as it could be causing injury or pelvic imbalance; see palpation section, core strengthening   Ward Chatters, PT 10/13/2021, 11:30 AM

## 2021-10-18 ENCOUNTER — Ambulatory Visit: Payer: 59

## 2021-10-18 DIAGNOSIS — M5459 Other low back pain: Secondary | ICD-10-CM

## 2021-10-18 DIAGNOSIS — M546 Pain in thoracic spine: Secondary | ICD-10-CM | POA: Diagnosis not present

## 2021-10-19 ENCOUNTER — Ambulatory Visit (INDEPENDENT_AMBULATORY_CARE_PROVIDER_SITE_OTHER): Payer: 59 | Admitting: Family Medicine

## 2021-10-19 ENCOUNTER — Encounter: Payer: Self-pay | Admitting: Family Medicine

## 2021-10-19 VITALS — BP 122/70 | HR 105 | Temp 99.0°F | Wt 115.4 lb

## 2021-10-19 DIAGNOSIS — F419 Anxiety disorder, unspecified: Secondary | ICD-10-CM | POA: Diagnosis not present

## 2021-10-19 DIAGNOSIS — R5383 Other fatigue: Secondary | ICD-10-CM

## 2021-10-19 DIAGNOSIS — F32A Depression, unspecified: Secondary | ICD-10-CM

## 2021-10-19 DIAGNOSIS — R000 Tachycardia, unspecified: Secondary | ICD-10-CM | POA: Diagnosis not present

## 2021-10-19 DIAGNOSIS — I10 Essential (primary) hypertension: Secondary | ICD-10-CM

## 2021-10-19 MED ORDER — ESCITALOPRAM OXALATE 10 MG PO TABS: 10.0000 mg | ORAL_TABLET | Freq: Every day | ORAL | 0 refills | Status: DC

## 2021-10-19 NOTE — Progress Notes (Signed)
   An in-person [place] interpreter used during entire encounter  SUBJECTIVE:   CHIEF COMPLAINT / HPI:   Fatigue, Anxious -Worsening over the last week -Not sleeping, racing thoughts at night -Over-thinking situations and stressing her out -Will sometimes develop somatic symptoms relieved when she does deep breathing or counting -Desires medication and is open to therapy -Hx of low vitamin D; taking supplement   Hypertension - Medications: Valsartan 80 mg daily - Compliance: Yes - Denies any SOB, CP, vision changes, LE edema, medication SEs, or symptoms of hypotension  PERTINENT  PMH / PSH: Hx of right thyroid nodule, chronic back pain  OBJECTIVE:   BP 122/70   Pulse (!) 105   Temp 99 F (37.2 C)   Wt 115 lb 6.4 oz (52.3 kg)   SpO2 98%   BMI 21.11 kg/m      10/19/2021    9:41 AM 08/23/2021    9:42 AM 07/23/2021    2:55 PM  PHQ9 SCORE ONLY  PHQ-9 Total Score 7 0 0   MDQ: negative for bipolar disorder  General: No acute distress. Age appropriate. Cardiac: RRR, normal heart sounds, no murmurs Respiratory: CTAB, normal effort Extremities: No edema or cyanosis. Skin: Warm and dry, no rashes noted Neuro: alert and oriented Psych: fast speech and anxious   ASSESSMENT/PLAN:   1. Mild depression Per PHQ9. Negative for SI/HI. Negative MDQ.  - TSH - escitalopram (LEXAPRO) 10 MG tablet; Take 1 tablet (10 mg total) by mouth daily.  Dispense: 30 tablet; Refill: 0 -Therapy resources given -Follow up in 2 weeks for mood check; consider increasing lexapro in 4-6 weeks.   2. Anxiety - escitalopram (LEXAPRO) 10 MG tablet; Take 1 tablet (10 mg total) by mouth daily.  Dispense: 30 tablet; Refill: 0 - GAD 7 at follow up  3. Tachycardia RRR on ausculation. Suspect due to anxious mood. Hx of thyroid nodule.  - TSH  4. Other fatigue Suspect due to lack of sleep 2/2 anxiety. Check electrolyte derangement - Basic Metabolic Panel  5. Benign essential HTN Well controlled.   -Continue current medications - Basic Metabolic Panel  Gerlene Fee, [place]

## 2021-10-20 ENCOUNTER — Ambulatory Visit: Payer: 59

## 2021-10-20 DIAGNOSIS — F32A Depression, unspecified: Secondary | ICD-10-CM | POA: Insufficient documentation

## 2021-10-20 DIAGNOSIS — R5383 Other fatigue: Secondary | ICD-10-CM | POA: Insufficient documentation

## 2021-10-20 DIAGNOSIS — R000 Tachycardia, unspecified: Secondary | ICD-10-CM | POA: Insufficient documentation

## 2021-10-20 DIAGNOSIS — M546 Pain in thoracic spine: Secondary | ICD-10-CM | POA: Diagnosis not present

## 2021-10-20 DIAGNOSIS — F419 Anxiety disorder, unspecified: Secondary | ICD-10-CM | POA: Insufficient documentation

## 2021-10-20 DIAGNOSIS — M5459 Other low back pain: Secondary | ICD-10-CM

## 2021-10-20 LAB — BASIC METABOLIC PANEL
BUN/Creatinine Ratio: 23 (ref 9–23)
BUN: 16 mg/dL (ref 6–24)
CO2: 20 mmol/L (ref 20–29)
Calcium: 9.5 mg/dL (ref 8.7–10.2)
Chloride: 98 mmol/L (ref 96–106)
Creatinine, Ser: 0.7 mg/dL (ref 0.57–1.00)
Glucose: 96 mg/dL (ref 70–99)
Potassium: 4.1 mmol/L (ref 3.5–5.2)
Sodium: 135 mmol/L (ref 134–144)
eGFR: 105 mL/min/{1.73_m2} (ref >59)

## 2021-10-20 LAB — TSH: TSH: 1.1 u[IU]/mL (ref 0.450–4.500)

## 2021-10-20 NOTE — Therapy (Addendum)
OUTPATIENT PHYSICAL THERAPY TREATMENT NOTE/DISCHARGE  PHYSICAL THERAPY DISCHARGE SUMMARY  Visits from Start of Care: 8  Current functional level related to goals / functional outcomes: See goals and objective   Remaining deficits: See goals and objective   Education / Equipment: HEP   Patient agrees to discharge. Patient goals were partially met. Patient is being discharged due to not returning since the last visit.   Patient Name: Maria [name] MRN: [id number] DOB:[birth year], 51 y.o., female Today's Date: 10/20/2021  PCP: none on file REFERRING PROVIDER: Rise Patience, DO  END OF SESSION:   PT End of Session - 10/20/21 1058     Visit Number 8    Number of Visits 10    Date for PT Re-Evaluation 11/06/21    Authorization Type [employer]    Progress Note Due on Visit 10    PT Start Time 1058    PT Stop Time 1128    PT Time Calculation (min) 30 min    Activity Tolerance Patient tolerated treatment well;No increased pain    Behavior During Therapy WFL for tasks assessed/performed                   Past Medical History:  Diagnosis Date   Diabetes mellitus without complication ([place])    Hypertension    Past Surgical History:  Procedure Laterality Date   RHINOPLASTY     Patient Active Problem List   Diagnosis Date Noted   Mild depression 10/20/2021   Anxiety 10/20/2021   Tachycardia 10/20/2021   Other fatigue 10/20/2021   Chronic bilateral back pain 07/23/2021   Benign essential HTN 05/05/2020   Vitamin D deficiency 05/05/2020   Pure hypercholesterolemia 05/05/2020   Headache disorder 03/31/2017   Uterine leiomyoma 08/03/2016   Nocturia 05/23/2016   Urinary frequency 05/16/2016   Right thyroid nodule 03/28/2016    REFERRING DIAG: M54.9,G89.29 (ICD-10-CM) - Chronic bilateral back pain, unspecified back location  THERAPY DIAG:  Pain in thoracic spine  Other low back pain  Rationale for Evaluation and Treatment Rehabilitation  PERTINENT  HISTORY: Started 5-6 years ago insidiously. Walking down the stairs, she received really bad back pain. Dr. Durward Fortes her to take meds and do certain exercises but she did not do exercises. Got worse 3 years ago again. Took meds and got better. 11/2020 had another flareup saw MD in [place] and dx her with disc herniation. Upper back pain due to age.   PRECAUTIONS: None  SUBJECTIVE:  Pt presents to PT with continued pain and occasional paresthesias in L LE. She has been compliant with HEP and notes that this continues to help. Pt is ready to begin PT at this time.   PAIN:  Are you having pain?  Yes: NPRS scale: 6/10 Pain location: lower back, L LE Pain description: constant Aggravating factors: standing Relieving factors: pain gets better than meds, walking , sitting   OBJECTIVE: (objective measures completed at initial evaluation unless otherwise dated)  TODAY'S TREATMENT  [facility] Adult PT Treatment:                                                DATE: 10/20/2021 Therapeutic Exercise: Supine Sciatic Nerve Glide x 15 each Seated Sciatic Tensioner x 15 each Supine Piriformis Stretch x 30" each Supine Bridge x 15 Cat Cow x 10 Quadruped Thoracic Rotation x 10 each  Bird Dog x 10 Seated bilateral ER with scapular retraction x 10 GTB Row x 10 GTB   PATIENT EDUCATION:  Education details: continue HEP Person educated: Patient and interpreter interpreted all Education method: Explanation Education comprehension: verbalized understanding     HOME EXERCISE PROGRAM: Access Code: [email or web address] URL: [email or web address] Date: 10/20/2021 Prepared by: Octavio Manns  Exercises - Supine Sciatic Nerve Glide  - 1 x daily - 7 x weekly - 2-3 sets - 15 reps - Seated Sciatic Tensioner  - 1 x daily - 7 x weekly - 2-3 sets - 15 reps - Supine Piriformis Stretch with Leg Straight  - 1 x daily - 7 x weekly - 2-3 reps - 30 sec hold - Supine Bridge  - 1 x daily - 7 x weekly - 3 sets - 15 reps -  Cat Cow  - 1 x daily - 7 x weekly - 2-3 sets - 10 reps - Quadruped Thoracic Rotation Full Range with Hand on Neck  - 1 x daily - 7 x weekly - 2-3 sets - 10 reps - Bird Dog  - 1 x daily - 7 x weekly - 2 sets - 10 reps - Shoulder External Rotation and Scapular Retraction with Resistance  - 1 x daily - 7 x weekly - 3 sets - 10 reps - green hold - Standing Shoulder Row with Anchored Resistance  - 1 x daily - 7 x weekly - 3 sets - 10 reps   ASSESSMENT:   CLINICAL IMPRESSION: Pt was able to complete prescribed exercises and demonstrated knowledge of HEP with no adverse effect. Over the course of PT treatment she had good initial progress with decreased pain and discomfort. Unfortunately, she has not been able to keep progressing and has continued pain and occasional paresthesias down L LE radiating down from her mid and lower back. She should continue to make some improvement with HEP compliance but will go back to PCP for follow up and determining next steps in POC.      OBJECTIVE IMPAIRMENTS decreased activity tolerance, postural dysfunction, and pain.    ACTIVITY LIMITATIONS lifting, standing, caring for others, and work tasks   PARTICIPATION LIMITATIONS: cleaning, laundry, and community activity    GOALS: Goals reviewed with patient? Yes   SHORT TERM GOALS: Target date: 10/23/2021   Pt will be indep with HEP to assist with pain  management Baseline:not initiated at evaluation due to time constraint Goal status: MET   2.  Pt will report improvement in L LE pain x 25% to assist with functional mobility Baseline: 0% constant Goal status: MET   3.  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  Pt will be able to perform gym program without increased shoulder, neck, hip or LBP Baseline: has pain with activities; to share with therapist at next appt what exercises she does; up to 6/10 Goal status: NOT MET   4.  Pt will be indep with HEP to assist with lumbar/core/cervical stability to assist with back pain management Baseline: not issued at eval Goal  status: MET   PLAN: PT FREQUENCY: 1-2x/week   PT DURATION: 6 weeks   PLANNED INTERVENTIONS: Therapeutic exercises, Therapeutic activity, Neuromuscular re-education, Patient/Family education, Joint mobilization, Dry Needling, Moist heat, and Taping.   PLAN FOR NEXT SESSION: issue HEP, make sure onsite interpreter; manual therapy for pelvic asymmetry and core stabilization TE, have her tell therapist what exercises she does at the gym and PT can let her know whether or not to continue as it could be causing injury or pelvic imbalance; see palpation section, core strengthening   Ward Chatters, PT 10/20/2021, 11:36 AM

## 2021-11-01 ENCOUNTER — Other Ambulatory Visit: Payer: Self-pay | Admitting: Family Medicine

## 2021-11-05 ENCOUNTER — Ambulatory Visit: Payer: 59 | Admitting: Family Medicine

## 2021-11-05 ENCOUNTER — Ambulatory Visit (INDEPENDENT_AMBULATORY_CARE_PROVIDER_SITE_OTHER): Payer: 59 | Admitting: Family Medicine

## 2021-11-05 VITALS — BP 139/87 | HR 80 | Temp 98.4°F | Wt 116.8 lb

## 2021-11-05 DIAGNOSIS — G8929 Other chronic pain: Secondary | ICD-10-CM

## 2021-11-05 DIAGNOSIS — M549 Dorsalgia, unspecified: Secondary | ICD-10-CM | POA: Diagnosis not present

## 2021-11-05 DIAGNOSIS — F419 Anxiety disorder, unspecified: Secondary | ICD-10-CM | POA: Diagnosis not present

## 2021-11-05 DIAGNOSIS — R69 Illness, unspecified: Secondary | ICD-10-CM | POA: Diagnosis not present

## 2021-11-05 MED ORDER — GABAPENTIN 300 MG PO CAPS: 300.0000 mg | ORAL_CAPSULE | Freq: Every day | ORAL | 3 refills | Status: AC

## 2021-11-05 NOTE — Assessment & Plan Note (Signed)
Patient not seeing enough improvements with conservative measures.  Still working with physical therapy.  Symptoms remain under producible on examination.  Given the lack of reproducible symptoms and vague diffuse symptoms, previous spinal x-rays with mild degeneration, did not order an MRI as do not feel that it would contribute much at this time.  Patient is still requesting an MRI, feel it is more appropriate to get evaluated by orthopedics.  Do not feel that sports medicine would have much more to offer at this time. - Referral to orthopedics placed - Increase gabapentin to 300 mg nightly - Continue physical therapy exercises - Continue other conservative management including stretching and exercises

## 2021-11-05 NOTE — Patient Instructions (Addendum)
-   Use [email or web address] to get a counselor  - Take escitalopram daily for your mood  -Take gabapentin 3 mg (1 tablet at night.  New prescription was sent.

## 2021-11-05 NOTE — Assessment & Plan Note (Signed)
GAD-7 score of 7.  Patient's main issue is racing thoughts at night when pain becomes more present. - Start Lexapro 10 mg - Follow-up in 4 weeks

## 2021-11-05 NOTE — Progress Notes (Cosign Needed Addendum)
    SUBJECTIVE:   CHIEF COMPLAINT / HPI:   In person [place] interpreter present during entire encounter  Anxiety? -Patient unclear if she has anxiety - Reports issues with falling asleep due to somatic complaints - Recent was prescribed Lexapro, did not start - Previously given therapy resources, has not reached out to anyone due to language barrier  Back pain - Unchanged from prior - Continues to report pain in various locations of her back and left leg - Does note some mild improvement with physical therapy - Currently is not having any pain or symptoms - Reports that baclofen did not improve symptoms, though she continued taking it - Unclear if gabapentin is of benefit - Patient requests MRI  PERTINENT  PMH / PSH: Reviewed  OBJECTIVE:   BP 139/87   Pulse 80   Temp 98.4 F (36.9 C)   Wt 116 lb 12.8 oz (53 kg)   SpO2 100%   BMI 21.36 kg/m   General: NAD, well-appearing, well-nourished Respiratory: No respiratory distress, breathing comfortably, able to speak in full sentences Skin: warm and dry, no rashes noted on exposed skin Psych: Appropriate affect and mood MSK: Cervical/thoracic/lumbar spine all nontender to palpation, full ROM of all  ASSESSMENT/PLAN:   Anxiety GAD-7 score of 7.  Patient's main issue is racing thoughts at night when pain becomes more present. - Start Lexapro 10 mg - Follow-up in 4 weeks  Chronic bilateral back pain Patient not seeing enough improvements with conservative measures.  Still working with physical therapy.  Symptoms remain under producible on examination.  Given the lack of reproducible symptoms and vague diffuse symptoms, previous spinal x-rays with mild degeneration, did not order an MRI as do not feel that it would contribute much at this time.  Patient is still requesting an MRI, feel it is more appropriate to get evaluated by orthopedics.  Do not feel that sports medicine would have much more to offer at this time. -  Referral to orthopedics placed - Increase gabapentin to 300 mg nightly - Continue physical therapy exercises - Continue other conservative management including stretching and exercises     Josefa [place], [place]

## 2021-11-10 ENCOUNTER — Encounter: Payer: Self-pay | Admitting: Family Medicine

## 2021-11-10 DIAGNOSIS — M546 Pain in thoracic spine: Secondary | ICD-10-CM | POA: Diagnosis not present

## 2021-11-10 DIAGNOSIS — M5106 Intervertebral disc disorders with myelopathy, lumbar region: Secondary | ICD-10-CM | POA: Diagnosis not present

## 2021-11-10 DIAGNOSIS — M542 Cervicalgia: Secondary | ICD-10-CM | POA: Diagnosis not present

## 2021-11-10 DIAGNOSIS — M545 Low back pain, unspecified: Secondary | ICD-10-CM | POA: Diagnosis not present

## 2021-11-11 NOTE — Progress Notes (Signed)
     SUBJECTIVE:   CHIEF COMPLAINT / HPI:   Maria [name] is a 51 y.o. female presents for follow up  [place] speaking interpretor present at visit  Thumb pain Pt reports pain and swelling of the lateral aspect of the right thumb over the last few days. It has also been draining pus and blood. 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  Hot flashes Pt reports a 2 week history of hot flashes at night causing sleep less nights . She reports sweating from head to toe. She stands up and stretches. She thinks the symptoms are due to being the perimenopause. LMP 7th July which lasted for 10 days. Still has her periods every month but they are now off by a few days.  As she ages they are getting more irregular. Denies fevers or unintentional weight loss. She also endorses a low libido.  [place] Office Visit from 11/05/2021 in [place]  PHQ-9 Total Score 3      PERTINENT  PMH / PSH: HTN, fibroids, depression, anxiety   OBJECTIVE:   BP 120/82   Pulse 90   Temp 98.6 F (37 C) (Oral)   Ht '5\' 2"'$  (1.575 m)   Wt 114 lb 6 oz (51.9 kg)   SpO2 100%   BMI 20.92 kg/m    General: Alert, no acute distress Cardio: well perfused  Pulm: normal work of breathing Neuro: Cranial nerves grossly intact      ASSESSMENT/PLAN:   Hot flashes Recent hot flashes most likely being in perimenopause. ACVD risk 0.8%. No contraindications to HRT however starting HRT does have considerable risks. Discussed the option starting HRT in the future but given only 2 week history I would like to monitor her sx for now. If persistent she should come back and see her PCP and discuss starting HRT. Conservative management for now including keeping room cool, wearing breathable clothing etc.   Paronychia of thumb, right Paronychia of right thumb. No abscess present. Recommended: removing nail polish, warm water soaks 3-4 time a day followed by antibiotic ointment. Given purulent drainage  will prescribe 7 days of Keflex. Advised against manicuring too close to the nail bed to avoid paronychia in future. Return precautions given to pt who expressed understanding.  Anxiety Pt was provided counseling resources at previous visit. Per interpretor pt needs referral for [facility] therapy in [place]. This is the only one who takes her insurance. Placed referral.    Maria Haw, MD PGY-3 [place]

## 2021-11-12 ENCOUNTER — Ambulatory Visit (INDEPENDENT_AMBULATORY_CARE_PROVIDER_SITE_OTHER): Payer: 59 | Admitting: Family Medicine

## 2021-11-12 ENCOUNTER — Encounter: Payer: Self-pay | Admitting: Family Medicine

## 2021-11-12 VITALS — BP 120/82 | HR 90 | Temp 98.6°F | Ht 62.0 in | Wt 114.4 lb

## 2021-11-12 DIAGNOSIS — F419 Anxiety disorder, unspecified: Secondary | ICD-10-CM | POA: Diagnosis not present

## 2021-11-12 DIAGNOSIS — R232 Flushing: Secondary | ICD-10-CM | POA: Diagnosis not present

## 2021-11-12 DIAGNOSIS — L03011 Cellulitis of right finger: Secondary | ICD-10-CM

## 2021-11-12 DIAGNOSIS — R69 Illness, unspecified: Secondary | ICD-10-CM | POA: Diagnosis not present

## 2021-11-12 MED ORDER — CEPHALEXIN 500 MG PO CAPS: 500.0000 mg | ORAL_CAPSULE | Freq: Four times a day (QID) | ORAL | 0 refills | Status: AC

## 2021-11-12 NOTE — Patient Instructions (Signed)
Thank you for coming to see me today. It was a pleasure. Today we discussed your infected finger nail. I recommend warm compressed 4 times a day, antibiotic ointment to the area 3-4 times a day, massage the pus out of the area. Start taking keflex for 7 days. Remove nail polish from area  Please follow-up with PCP if you are still having hot flashes in 1 -2 months, can consider starting hormone replacement therapy for your symptoms  If you have any questions or concerns, please do not hesitate to call the office at [phone].  Best wishes,   Dr Posey Pronto

## 2021-11-15 DIAGNOSIS — L03011 Cellulitis of right finger: Secondary | ICD-10-CM | POA: Insufficient documentation

## 2021-11-15 DIAGNOSIS — R232 Flushing: Secondary | ICD-10-CM | POA: Insufficient documentation

## 2021-11-15 NOTE — Assessment & Plan Note (Signed)
Recent hot flashes most likely being in perimenopause. ACVD risk 0.8%. No contraindications to HRT however starting HRT does have considerable risks. Discussed the option starting HRT in the future but given only 2 week history I would like to monitor her sx for now. If persistent she should come back and see her PCP and discuss starting HRT. Conservative management for now including keeping room cool, wearing breathable clothing etc.

## 2021-11-15 NOTE — Assessment & Plan Note (Signed)
Pt was provided counseling resources at previous visit. Per interpretor pt needs referral for [facility] therapy in [place]. This is the only one who takes her insurance. Placed referral.

## 2021-11-15 NOTE — Assessment & Plan Note (Addendum)
Paronychia of right thumb. No abscess present. Recommended: removing nail polish, warm water soaks 3-4 time a day followed by antibiotic ointment. Given purulent drainage will prescribe 7 days of Keflex. Advised against manicuring too close to the nail bed to avoid paronychia in future. Return precautions given to pt who expressed understanding.

## 2021-11-18 ENCOUNTER — Ambulatory Visit (INDEPENDENT_AMBULATORY_CARE_PROVIDER_SITE_OTHER): Payer: 59 | Admitting: Orthopaedic Surgery

## 2021-11-18 DIAGNOSIS — M549 Dorsalgia, unspecified: Secondary | ICD-10-CM | POA: Diagnosis not present

## 2021-11-18 DIAGNOSIS — M5416 Radiculopathy, lumbar region: Secondary | ICD-10-CM

## 2021-11-18 NOTE — Progress Notes (Signed)
Chief Complaint: Lower back pain upper back pain     History of Present Illness:    Maria [name] is a 51 y.o. female presents with several years of lower back pain and upper back pain.  She states for the last several months she feels like this is radiating to the back neck as well as to the lumbar spine.  She has been seeing her primary care doctor.  She is also developed sharp shooting pain down the entirety of the left leg over the course of the last several months as well.  Her primary care doctor has trialed multiple treatments including gabapentin and anti-inflammatories such as naproxen.  None of these worked.  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    Surgical History:   None  PMH/PSH/Family History/Social History/Meds/Allergies:    Past Medical History:  Diagnosis Date   Diabetes mellitus without complication ([place])    Hypertension    Past Surgical History:  Procedure Laterality Date   RHINOPLASTY     Social History   Socioeconomic History   Marital status: Divorced    Spouse name: Not on file   Number of children: Not on file   Years of education: Not on file   Highest education level: Not on file  Occupational History   Not on file  Tobacco Use   Smoking status: Never   Smokeless tobacco: Never  Vaping Use   Vaping Use: Never used  Substance and Sexual Activity   Alcohol use: Not Currently    Comment: occasionally   Drug use: No   Sexual activity: Yes    Partners: Male    Birth control/protection: Condom    Comment: married  Other Topics Concern   Not on file  Social History Narrative     Marital status: Married      Children:  2 children (37, 37)      Lives: with 2 children.      Employment:  Insurance claims handler; full time      Tobacco: none      Alcohol: none      Drugs: none      Exercise: none   Social Determinants of Radio broadcast assistant Strain: Not on file  Food  Insecurity: Not on file  Transportation Needs: Not on file  Physical Activity: Not on file  Stress: Not on file  Social Connections: Not on file   Family History  Problem Relation Age of Onset   Hypertension Father    No Known Allergies Current Outpatient Medications  Medication Sig Dispense Refill   baclofen (LIORESAL) 10 MG tablet TAKE 1 TABLET(10 MG) BY MOUTH TWICE DAILY 60 tablet 0   cephALEXin (KEFLEX) 500 MG capsule Take 1 capsule (500 mg total) by mouth 4 (four) times daily for 7 days. 28 capsule 0   escitalopram (LEXAPRO) 10 MG tablet Take 1 tablet (10 mg total) by mouth daily. 30 tablet 0   gabapentin (NEURONTIN) 300 MG capsule Take 1 capsule (300 mg total) by mouth at bedtime. 90 capsule 3   valsartan (DIOVAN) 80 MG tablet Take 1 tablet (80 mg total) by mouth daily. 90 tablet 3   No current facility-administered medications for this visit.   No results found.  Review of  Systems:   A ROS was performed including pertinent positives and negatives as documented in the HPI.  Physical Exam :   Constitutional: NAD and appears stated age Neurological: Alert and oriented Psych: Appropriate affect and cooperative There were no vitals taken for this visit.   Comprehensive Musculoskeletal Exam:    Tenderness palpation about the lumbar spine.  Positive straight leg raise on the left.  Overall distally neurovascularly intact with regard to the left leg.  Reflexes are symmetric  Imaging:     I personally reviewed and interpreted the radiographs.   Assessment:   51 y.o. female with symptoms consistent with lumbar radiculopathy which has been going on for several years now.  At this time I would like to plan for referral to physical therapy to help her with her upper back tension and pain as well.  I believe much of this is posturally related.  I believe that she would benefit from an upper back strengthening program as well as dry needling.  I would like to perform an MRI as I do  believe that she has a lumbar disc herniation that would benefit from an epidural steroid injection.  To that effect I think this would help to localize the level much better.  Plan :    -Plan for MRI lumbar spine -Plan for physical therapy for strengthening of her upper and lower back as well as candidate for dry needling     I personally saw and evaluated the patient, and participated in the management and treatment plan.  Vanetta Mulders, MD Attending Physician, Orthopedic Surgery  This document was dictated using Dragon voice recognition software. A reasonable attempt at proof reading has been made to minimize errors.

## 2021-11-29 ENCOUNTER — Ambulatory Visit: Payer: 59

## 2021-12-06 ENCOUNTER — Encounter: Payer: Self-pay | Admitting: Family Medicine

## 2021-12-06 ENCOUNTER — Ambulatory Visit (INDEPENDENT_AMBULATORY_CARE_PROVIDER_SITE_OTHER): Payer: 59 | Admitting: Family Medicine

## 2021-12-06 VITALS — BP 110/78 | HR 70 | Ht 62.0 in | Wt 115.4 lb

## 2021-12-06 DIAGNOSIS — I10 Essential (primary) hypertension: Secondary | ICD-10-CM | POA: Diagnosis not present

## 2021-12-06 DIAGNOSIS — R69 Illness, unspecified: Secondary | ICD-10-CM | POA: Diagnosis not present

## 2021-12-06 DIAGNOSIS — M549 Dorsalgia, unspecified: Secondary | ICD-10-CM | POA: Diagnosis not present

## 2021-12-06 DIAGNOSIS — G8929 Other chronic pain: Secondary | ICD-10-CM

## 2021-12-06 DIAGNOSIS — F419 Anxiety disorder, unspecified: Secondary | ICD-10-CM

## 2021-12-06 NOTE — Progress Notes (Signed)
    SUBJECTIVE:   CHIEF COMPLAINT / HPI:   [place] interpreter used for entire encounter.   Recent paronychia - Did not take the antibiotic - Has had significant improvement in symptoms  Anxiety - Referral placed for [facility] in [place] at last visit - Is not taking Lexapro that was previously prescribed  Back pain - Recently seen by orthopedics - Has follow-up appointment on 8/24  PERTINENT  PMH / PSH: Reviewed  OBJECTIVE:   BP 110/78   Pulse 70   Ht '5\' 2"'$  (1.575 m)   Wt 115 lb 6 oz (52.3 kg)   SpO2 100%   BMI 21.10 kg/m   General: NAD, well-appearing, well-nourished Respiratory: No respiratory distress, breathing comfortably, able to speak in full sentences Skin: warm and dry, no rashes noted on exposed skin. Previously pictured paronychia completely resolved.  Psych: Appropriate affect and mood  ASSESSMENT/PLAN:   Anxiety Has referral from previous encounter. Is not taking lexapro, currently stable.  - Monitor   Benign essential HTN BP well controlled on Valsartan '80mg'$  daily.  - Continue valsartan - Labs next year  Chronic bilateral back pain Now being managed by orthopedics. Is still taking Gabapentin at night as prescribed.      Rise Patience, [place]

## 2021-12-06 NOTE — Assessment & Plan Note (Signed)
Now being managed by orthopedics. Is still taking Gabapentin at night as prescribed.

## 2021-12-06 NOTE — Assessment & Plan Note (Signed)
Has referral from previous encounter. Is not taking lexapro, currently stable.  - Monitor

## 2021-12-06 NOTE — Patient Instructions (Addendum)
Everything looks great today, we do not need labs as your recent labs looked really good.  Your previous x-rays were all negative besides a little bit of arthritis sign.  Continue to take your gabapentin for your pain.  Please continue to see the orthopedic physician as well.  We will follow-up in 6 months for your blood pressure.

## 2021-12-06 NOTE — Assessment & Plan Note (Signed)
BP well controlled on Valsartan '80mg'$  daily.  - Continue valsartan - Labs next year

## 2021-12-16 ENCOUNTER — Ambulatory Visit (HOSPITAL_BASED_OUTPATIENT_CLINIC_OR_DEPARTMENT_OTHER): Payer: 59 | Admitting: Orthopaedic Surgery

## 2021-12-22 ENCOUNTER — Encounter: Payer: Self-pay | Admitting: Family Medicine

## 2021-12-22 ENCOUNTER — Ambulatory Visit (INDEPENDENT_AMBULATORY_CARE_PROVIDER_SITE_OTHER): Payer: 59 | Admitting: Family Medicine

## 2021-12-22 DIAGNOSIS — M549 Dorsalgia, unspecified: Secondary | ICD-10-CM | POA: Diagnosis not present

## 2021-12-22 DIAGNOSIS — I10 Essential (primary) hypertension: Secondary | ICD-10-CM

## 2021-12-22 DIAGNOSIS — G8929 Other chronic pain: Secondary | ICD-10-CM

## 2021-12-22 MED ORDER — VALSARTAN 80 MG PO TABS
80.0000 mg | ORAL_TABLET | Freq: Every day | ORAL | 3 refills | Status: DC
Start: 2021-12-22 — End: 2022-02-21

## 2021-12-22 NOTE — Assessment & Plan Note (Signed)
Refill of valsartan sent to the [facility]

## 2021-12-22 NOTE — Assessment & Plan Note (Signed)
-   Phone number for orthopedic office was provided for patient to follow-up with an appointment - Has MRI scheduled from orthopedic office in August

## 2021-12-22 NOTE — Progress Notes (Signed)
    SUBJECTIVE:   CHIEF COMPLAINT / HPI:   Medication refills - Patient reports that she was having an issue with her valsartan at [facility] and [facility]  Back pain/leg pain - Patient's last appoint with orthopedics was canceled by provider. - Has MRI already scheduled for August - Patient has not called to make an appointment with orthopedics yet  Hemoptysis - Patient notes still intermittent hemoptysis - Has not followed up with pulmonology since first visit  PERTINENT  PMH / PSH: Reviewed  OBJECTIVE:   BP (!) 132/90   Pulse 75   Ht '5\' 2"'$  (1.575 m)   Wt 115 lb 6.4 oz (52.3 kg)   LMP 11/27/2021   SpO2 100%   BMI 21.11 kg/m   Gen: well-appearing, NAD CV: RRR, no m/r/g appreciated, no peripheral edema Pulm: CTAB, no wheezes/crackles GI: soft, non-tender, non-distended  ASSESSMENT/PLAN:   Benign essential HTN [facility]  Chronic bilateral back pain - Phone number for orthopedic office was provided for patient to follow-up with an appointment - Has MRI scheduled from orthopedic office in August   Hemoptysis Prior CT scan of the chest done last year did show a lung nodule and pulmonology had recommended a follow-up if the patient was high risk.  Patient has not followed up since that initial visit. - Encouraged patient to follow-up with pulmonology - Pulmonologist office phone number given to patient to call for appointment  Rise Patience, [place]

## 2021-12-22 NOTE — Patient Instructions (Addendum)
If you are still coughing up blood you need to call the pulmonologist office [facility] pulmonology), who you previously saw last year.  The phone number is [phone]   For your continued back pain, make sure to call back the orthopedic office.  Phone number is [phone]    Your blood pressure medication was sent to the pharmacy, you can call them if you need further refills and they will let me know.

## 2021-12-23 ENCOUNTER — Ambulatory Visit
Admission: RE | Admit: 2021-12-23 | Discharge: 2021-12-23 | Disposition: A | Discharge status: Home / Self Care | Payer: 59 | Source: Ambulatory Visit | Attending: Orthopaedic Surgery | Admitting: Orthopaedic Surgery

## 2021-12-23 DIAGNOSIS — M5416 Radiculopathy, lumbar region: Secondary | ICD-10-CM

## 2021-12-23 DIAGNOSIS — M545 Low back pain, unspecified: Secondary | ICD-10-CM | POA: Diagnosis not present

## 2021-12-23 DIAGNOSIS — M47816 Spondylosis without myelopathy or radiculopathy, lumbar region: Secondary | ICD-10-CM | POA: Diagnosis not present

## 2021-12-23 DIAGNOSIS — M549 Dorsalgia, unspecified: Secondary | ICD-10-CM

## 2022-01-17 ENCOUNTER — Encounter: Payer: Self-pay | Admitting: Student

## 2022-01-17 NOTE — Progress Notes (Signed)
  SUBJECTIVE:   CHIEF COMPLAINT / HPI:   Hot flashes: >1 month ago, symptoms of hot flashes all over her body. Discussed with Dr. Posey Pronto in the past. LMP 1st week of august. She is experiencing them every day consistently. She experiences these at night too.  Denies personal or family history of breast/ovarian/uterine cancer. No history of blood clots or clotting disorder. She is requesting medications to help with her hot flashes.  Video interpreter used throughout encounter: [place]  PERTINENT  PMH / PSH: HTN  OBJECTIVE:  BP 124/80   Pulse 81   Temp 99.5 F (37.5 C) (Oral)   Ht '5\' 2"'$  (1.575 m)   Wt 118 lb 3.2 oz (53.6 kg)   LMP 11/27/2021   SpO2 100%   BMI 21.62 kg/m   General: NAD, pleasant, able to participate in exam Cardiac: RRR, no murmurs auscultated Respiratory: CTAB, normal WOB Psych: Normal affect and mood  ASSESSMENT/PLAN:  Hot flashes Assessment & Plan: Perimenopausal, persistent vasomotor sxs. No contraindications to HRT. Discussed risks vs. benefits. Opted to start treatment with combined estrogen/progesterone method given patient does have uterus. Thoroughly discussed treatment dosing several times: estrogen daily, progesterone daily for the first 12 days of month. Pt stated she understood treatment regimen. Start treatment at the beginning of the month. Follow up in 1 month to discuss response to treatment. May need to titrate dosing based on response.  Orders: -     Estradiol; Take 1 tablet (0.5 mg total) by mouth daily.  Dispense: 30 tablet; Refill: 2 -     Progesterone; Take one tablet daily for the first 12 days of each month  Dispense: 12 capsule; Refill: 2   Return in about 1 month (around 02/17/2022) for HRT follow-up. Wells Guiles, DO 01/18/2022, 5:18 PM PGY-2, [place]

## 2022-01-18 ENCOUNTER — Encounter: Payer: Self-pay | Admitting: Student

## 2022-01-18 ENCOUNTER — Ambulatory Visit (INDEPENDENT_AMBULATORY_CARE_PROVIDER_SITE_OTHER): Payer: 59 | Admitting: Student

## 2022-01-18 VITALS — BP 124/80 | HR 81 | Temp 99.5°F | Ht 62.0 in | Wt 118.2 lb

## 2022-01-18 DIAGNOSIS — R232 Flushing: Secondary | ICD-10-CM | POA: Diagnosis not present

## 2022-01-18 MED ORDER — ESTRADIOL 0.5 MG PO TABS: 0.5000 mg | ORAL_TABLET | Freq: Every day | ORAL | 2 refills | Status: DC

## 2022-01-18 MED ORDER — PROGESTERONE 200 MG PO CAPS: ORAL_CAPSULE | ORAL | 2 refills | Status: AC

## 2022-01-18 NOTE — Assessment & Plan Note (Signed)
Perimenopausal, persistent vasomotor sxs. No contraindications to HRT. Discussed risks vs. benefits. Opted to start treatment with combined estrogen/progesterone method given patient does have uterus. Thoroughly discussed treatment dosing several times: estrogen daily, progesterone daily for the first 12 days of month. Pt stated she understood treatment regimen. Start treatment at the beginning of the month. Follow up in 1 month to discuss response to treatment. May need to titrate dosing based on response.

## 2022-01-18 NOTE — Patient Instructions (Addendum)
It was great to see you today! Thank you for choosing [facility] for your primary care. Maria [name] was seen for high flashes.  Today we addressed: Flashes: I am starting you on hormone replacement therapy.  Please take the estrogen pill daily.  The progesterone pill you may take once a day in the first 12 days of the month.  Please follow-up in 1 month to discuss if this is working for you or if changes need to be made with Dr. Oleh Genin.  If you haven't already, sign up for My Chart to have easy access to your labs results, and communication with your primary care physician.  I recommend that you always bring your medications to each appointment as this makes it easy to ensure you are on the correct medications and helps [place] not miss refills when you need them. Call the clinic at [phone] if your symptoms worsen or you have any concerns.  You should return to our clinic Return in about 1 month (around 02/17/2022) for HRT follow-up. Please arrive 15 minutes before your appointment to ensure smooth check in process.  We appreciate your efforts in making this happen.  Thank you for allowing me to participate in your care, Wells Guiles, DO 01/18/2022, 10:48 AM PGY-2, [place]

## 2022-02-11 ENCOUNTER — Other Ambulatory Visit: Payer: Self-pay | Admitting: Student

## 2022-02-11 DIAGNOSIS — R232 Flushing: Secondary | ICD-10-CM

## 2022-02-21 ENCOUNTER — Ambulatory Visit: Payer: 59 | Admitting: Family Medicine

## 2022-02-21 ENCOUNTER — Encounter: Payer: Self-pay | Admitting: Family Medicine

## 2022-02-21 VITALS — BP 125/83 | HR 76 | Ht 62.0 in | Wt 122.0 lb

## 2022-02-21 DIAGNOSIS — M549 Dorsalgia, unspecified: Secondary | ICD-10-CM

## 2022-02-21 DIAGNOSIS — R232 Flushing: Secondary | ICD-10-CM | POA: Diagnosis not present

## 2022-02-21 DIAGNOSIS — G8929 Other chronic pain: Secondary | ICD-10-CM

## 2022-02-21 DIAGNOSIS — I10 Essential (primary) hypertension: Secondary | ICD-10-CM

## 2022-02-21 MED ORDER — VALSARTAN 80 MG PO TABS: 80.0000 mg | ORAL_TABLET | Freq: Every day | ORAL | 3 refills | Status: DC

## 2022-02-21 NOTE — Assessment & Plan Note (Signed)
Has had some improvement in vasomotor symptoms. Continuing current regimen, will follow-up in 2 months for titration as needed.  - Estrogen daily - Progesterone daily for the first 12 days of the month

## 2022-02-21 NOTE — Progress Notes (Signed)
    SUBJECTIVE:   CHIEF COMPLAINT / HPI:   [place] interpreter used during entire visit.   Hot flashes - Prescribed at last visit - Estradiol: 0.'5mg'$  daily, Progesterone once daily for the first 12 days of each month - Has somewhat improved already in hot flash frequency - Started period today - Would like to keep on this dose for a bit as she gets used to it  [employer] of symptoms - Patient reports multiple different symptoms  - Has upper shoulder/neck pain that is tender  - "hot flash/burning" sensation that goes up from legs  - numbness that will occur from her neck and go up her face - she is wanting an answer and medications to fix everything  PERTINENT  PMH / PSH: Reviewed  OBJECTIVE:   BP 125/83   Pulse 76   Ht '5\' 2"'$  (1.575 m)   Wt 122 lb (55.3 kg)   LMP 02/21/2022   SpO2 99%   BMI 22.31 kg/m   General: NAD, well-appearing, well-nourished Respiratory: No respiratory distress, breathing comfortably, able to speak in full sentences Skin: warm and dry, no rashes noted on exposed skin Psych: Appropriate affect and mood  ASSESSMENT/PLAN:   Hot flashes Has had some improvement in vasomotor symptoms. Continuing current regimen, will follow-up in 2 months for titration as needed.  - Estrogen daily - Progesterone daily for the first 12 days of the month  Benign essential HTN BP well controlled on Valsartan '80mg'$ , currently 125/83. - Continue current regimen   Multiple concerns/complaints Patient has difficulty with explaining well the constellation of symptoms and I am not sure what to make of some of them. She has previously seen orthopedics and I recommended possibly touching base with their practice again if she is still having back pain concerns. Given the constellation and inability to determine if there are triggers or patterns to this, recommend that patient keep a daily diary of symptoms so that we can explore everything she is feeling without having to  rehash the same problems multiple times in one visit.  - Daily symptom journal - follow-up with me in 1 month - For any acute concerns unable to be addressed due to time, make appt within the week - Tylenol as needed for pain/tender symptoms   Rise Patience, [place]

## 2022-02-21 NOTE — Patient Instructions (Addendum)
It was so great seeing you today! Today we discussed the following:  - Please start doing a daily journal of your symptoms - Continue the medications as prescribed and we will follow-up in 2 months to see how your hot flashes are doing - Take Tylenol for the headaches or upper shoulder pain you are having and consider following up with the orthopedic doctor.   For your continued back pain, make sure to call back the orthopedic office.  Phone number is [phone]   Please make sure to bring any medications you take to your appointments. If you have any questions or concerns please call the office at [phone].

## 2022-02-21 NOTE — Assessment & Plan Note (Signed)
BP well controlled on Valsartan '80mg'$ , currently 125/83. - Continue current regimen

## 2022-03-02 ENCOUNTER — Encounter: Payer: Self-pay | Admitting: Student

## 2022-03-02 ENCOUNTER — Ambulatory Visit: Payer: 59 | Admitting: Student

## 2022-03-02 VITALS — BP 122/79 | HR 73 | Ht 62.0 in | Wt 120.2 lb

## 2022-03-02 DIAGNOSIS — I10 Essential (primary) hypertension: Secondary | ICD-10-CM

## 2022-03-02 DIAGNOSIS — M549 Dorsalgia, unspecified: Secondary | ICD-10-CM

## 2022-03-02 DIAGNOSIS — M792 Neuralgia and neuritis, unspecified: Secondary | ICD-10-CM | POA: Diagnosis not present

## 2022-03-02 DIAGNOSIS — R042 Hemoptysis: Secondary | ICD-10-CM

## 2022-03-02 DIAGNOSIS — G8929 Other chronic pain: Secondary | ICD-10-CM

## 2022-03-02 MED ORDER — DULOXETINE HCL 30 MG PO CPEP: 30.0000 mg | ORAL_CAPSULE | Freq: Every day | ORAL | 3 refills | Status: DC

## 2022-03-02 NOTE — Patient Instructions (Addendum)
It was great to see you today! Thank you for choosing [facility] for your primary care. Maria [name] was seen for follow up.  Today we addressed: We can start the Cymbalta to take 30 mg once daily for one week and then can increase to 60 mg daily afterwards.  Continue with the Gabapentin  I am checking a blood test as well  Continue with stretching exercises daily    If you haven't already, sign up for My Chart to have easy access to your labs results, and communication with your primary care physician.  I recommend that you always bring your medications to each appointment as this makes it easy to ensure you are on the correct medications and helps [place] not miss refills when you need them. Call the clinic at [phone] if your symptoms worsen or you have any concerns.  You should return to our clinic Return in about 4 weeks (around 03/30/2022) for with Dr. Oleh Genin for follow up. Please arrive 15 minutes before your appointment to ensure smooth check in process.  We appreciate your efforts in making this happen.  Thank you for allowing me to participate in your care, [name], MD 03/02/2022, 9:55 AM PGY-2, [place]

## 2022-03-02 NOTE — Progress Notes (Signed)
SUBJECTIVE:   CHIEF COMPLAINT / HPI:   Interpreter via IpadGwendalyn [name] [phone]  Hot Flashes  Estrogen daily, progesterone daily for the first 12 days of the month were her last instructions. Continuing with current medication regimen   Pain all over body  Sharp pains all over her body, for many months  She reports of ear ringing, noisy sound in the head  Pain from the back down to the legs Feels like her head is "shaky"  Takes Gabapentin on and off and is not helping with the pain  She can not define an area of pain, reporting that the pain is just "all over her body."  HA, syncope Numbness when sleeping in the hands  Sharp pain radiating to chest from abdomen; related to pain all over the body Sharp pain over lower belly   Patient also passively mentions hemoptysis that is recurrent and is unable to quantify amount. Had a CT scan of the chest that was overall unremarkable. Never smoker.   PERTINENT  PMH / PSH:   Past Medical History:  Diagnosis Date   Hypertension    OBJECTIVE:  BP 122/79   Pulse 73   Ht '5\' 2"'$  (1.575 m)   Wt 120 lb 3.2 oz (54.5 kg)   LMP 02/21/2022   SpO2 100%   BMI 21.98 kg/m  Physical Exam Vitals reviewed.  Constitutional:      Appearance: Normal appearance.  HENT:     Head: Normocephalic and atraumatic.     Right Ear: Tympanic membrane normal.     Left Ear: Tympanic membrane normal.     Nose: Nose normal.     Mouth/Throat:     Mouth: Mucous membranes are moist.  Eyes:     Extraocular Movements: Extraocular movements intact.     Conjunctiva/sclera: Conjunctivae normal.     Pupils: Pupils are equal, round, and reactive to light.  Cardiovascular:     Rate and Rhythm: Normal rate and regular rhythm.  Pulmonary:     Effort: Pulmonary effort is normal.     Breath sounds: Normal breath sounds.  Musculoskeletal:        General: Normal range of motion.     Cervical back: Normal range of motion.  Skin:    General: Skin is warm.     Capillary Refill:  Capillary refill takes less than 2 seconds.  Neurological:     General: No focal deficit present.     Mental Status: She is alert and oriented to person, place, and time.     Cranial Nerves: No cranial nerve deficit.     Sensory: No sensory deficit.  Psychiatric:        Mood and Affect: Mood normal.        Behavior: Behavior normal.      ASSESSMENT/PLAN:  Chronic bilateral back pain, unspecified back location -     DULoxetine HCl; Take 1 capsule (30 mg total) by mouth daily. Then can take 60 mg daily after one week of 30 mg  Dispense: 30 capsule; Refill: 3  Nerve pain Assessment & Plan: Patient appears to be describing neuropathic pain but reassuringly has no focal neurologic deficits and a normal neurologic examination while hemodynamically stable in office.  She has had complaints such as this during prior appointments.  She could be having symptoms of arthritis versus neuropathic pain possibly in the setting of fibromyalgia.  No red flag symptoms at this time.  Treatment of pain we will continue with Cymbalta 30 daily and  increase to 60 as tolerated after 1 week.  Continue with gabapentin dose she was previously on.  Follow-up with PCP, may consider inflammatory markers if indicated.  Orders: -     DULoxetine HCl; Take 1 capsule (30 mg total) by mouth daily. Then can take 60 mg daily after one week of 30 mg  Dispense: 30 capsule; Refill: 3  Hemoptysis Assessment & Plan: Reassuringly CT scan of the chest showed low risk nodule as patient is not a smoker.  Otherwise was unremarkable.  Pulmonary examination unremarkable on physical exam.  We will continue with quant gold testing for TB as I am unable to find it previously and documentation.  Follow-up with PCP for further assessment as needed.  Orders: -     QuantiFERON-TB Gold Plus -     QuantiFERON-TB Gold Plus  Benign essential HTN Assessment & Plan: Stable today     Return in about 4 weeks (around 03/30/2022) for with Dr.  Oleh Genin for follow up. [name], MD 03/03/2022, 7:43 AM PGY-2, [place]

## 2022-03-03 DIAGNOSIS — R042 Hemoptysis: Secondary | ICD-10-CM | POA: Insufficient documentation

## 2022-03-03 NOTE — Assessment & Plan Note (Signed)
Patient appears to be describing neuropathic pain but reassuringly has no focal neurologic deficits and a normal neurologic examination while hemodynamically stable in office.  She has had complaints such as this during prior appointments.  She could be having symptoms of arthritis versus neuropathic pain possibly in the setting of fibromyalgia.  No red flag symptoms at this time.  Treatment of pain we will continue with Cymbalta 30 daily and increase to 60 as tolerated after 1 week.  Continue with gabapentin dose she was previously on.  Follow-up with PCP, may consider inflammatory markers if indicated.

## 2022-03-03 NOTE — Assessment & Plan Note (Signed)
Reassuringly CT scan of the chest showed low risk nodule as patient is not a smoker.  Otherwise was unremarkable.  Pulmonary examination unremarkable on physical exam.  We will continue with quant gold testing for TB as I am unable to find it previously and documentation.  Follow-up with PCP for further assessment as needed.

## 2022-03-03 NOTE — Assessment & Plan Note (Signed)
Stable today.

## 2022-03-05 LAB — QUANTIFERON-TB GOLD PLUS
QuantiFERON Mitogen Value: 2.38 IU/mL
QuantiFERON Nil Value: 0.79 IU/mL
QuantiFERON TB1 Ag Value: 0.09 IU/mL
QuantiFERON TB2 Ag Value: 0.12 IU/mL
QuantiFERON-TB Gold Plus: NEGATIVE

## 2022-03-09 ENCOUNTER — Other Ambulatory Visit: Payer: Self-pay | Admitting: Student

## 2022-03-09 DIAGNOSIS — M792 Neuralgia and neuritis, unspecified: Secondary | ICD-10-CM

## 2022-03-09 DIAGNOSIS — G8929 Other chronic pain: Secondary | ICD-10-CM

## 2022-03-14 ENCOUNTER — Encounter: Payer: Self-pay | Admitting: Student

## 2022-03-16 ENCOUNTER — Encounter: Payer: Self-pay | Admitting: Student

## 2022-03-16 ENCOUNTER — Ambulatory Visit (INDEPENDENT_AMBULATORY_CARE_PROVIDER_SITE_OTHER): Payer: 59 | Admitting: Student

## 2022-03-16 VITALS — BP 119/88 | HR 91 | Wt 121.0 lb

## 2022-03-16 DIAGNOSIS — M792 Neuralgia and neuritis, unspecified: Secondary | ICD-10-CM

## 2022-03-16 DIAGNOSIS — I10 Essential (primary) hypertension: Secondary | ICD-10-CM | POA: Diagnosis not present

## 2022-03-16 DIAGNOSIS — R300 Dysuria: Secondary | ICD-10-CM | POA: Insufficient documentation

## 2022-03-16 LAB — POCT URINALYSIS DIP (MANUAL ENTRY)
Bilirubin, UA: NEGATIVE
Blood, UA: NEGATIVE
Glucose, UA: NEGATIVE mg/dL
Ketones, POC UA: NEGATIVE mg/dL
Leukocytes, UA: NEGATIVE
Nitrite, UA: NEGATIVE
Protein Ur, POC: NEGATIVE mg/dL
Spec Grav, UA: 1.02 (ref 1.010–1.025)
Urobilinogen, UA: 0.2 E.U./dL
pH, UA: 7 (ref 5.0–8.0)

## 2022-03-16 NOTE — Patient Instructions (Addendum)
It was great to see you today! Thank you for choosing [facility] for your primary care. Maria [name] was seen for follow up.  Today we addressed: Continuing with lab work  Continue with the Cymbalta and Gabapentin   For information on therapists, please go to [email or web address]. You can also contact your insurance company to find an Theatre manager.  [place] Providers (No Insurance required or Self Pay)  [place] and [place]  [phone] [email or web address] .[place] [facility]  [address] [place], [place] Crisis [phone]  [facility]) can see uninsured folks for outpatient therapy [email or web address] [address] [place], [place] [address] [phone]  [place] Mon-Fri, 8am-3pm [email or web address], [place], [place]   [place] [phone]- [facility] for psych med management, there may be a wait- if [facility] is working with clients for OPT, they will coordinate with [place] for Agency   Walk-in-Clinic: Monday- Friday 9:00 AM - 4:00 PM [place], [place] (336) [phone]  [facility] the [place] ([employer]) walk in M-F 8am-12pm and  1pm-3pm [place]- [place]  [place]  Phone: [phone]  [place] ([place] and substance challenges) [address], [place] [phone]    [email or web address], [place]     Phone:  [phone] [place]  [phone]    [facility] ( virtual or zoom therapy) [email or web address]  [place]  [place]    [facility] [phone]  grief counseling, dementia and caregiver support    [facility] Services Walk-in MWF 12:30 to 3:00     [place] [address]  [phone]  [email or web address] call to schedule an appointment     National Alliance on Mental Illness (NAMI) [place]- Wellness classes, Support groups        [address], [place], [place] [address] [phone]   CurrentJokes.cz   [facility]  (Psycho-social Rehabilitation clubhouse, Individual and group therapy) [address] [place], [place] [address]   [phone]  24- Hour Availability:  *[place] or [phone] * [facility] the [employer] (Domestic Violence, Rape, etc. )[phone] [place] [phone] or [phone] * [facility] [place] [facility] [phone] only) [phone] (after hours) *[facility] Mobile Crisis Unit [phone] *[place] National Suicide Hotline 8283566090 Diamantina Monks)        If you haven't already, sign up for My Chart to have easy access to your labs results, and communication with your primary care physician.  I recommend that you always bring your medications to each appointment as this makes it easy to ensure you are on the correct medications and helps [place] not miss refills when you need them. Call the clinic at [phone] if your symptoms worsen or you have any concerns.  You should return to our clinic Return in about 3 weeks (around 04/06/2022) for [place] . Please arrive 15 minutes before your appointment to ensure smooth check in process.  We appreciate your efforts in making this happen.  Thank you for allowing me to participate in your care, [name], MD 03/16/2022, 10:30 AM PGY-2, [place]

## 2022-03-16 NOTE — Progress Notes (Signed)
SUBJECTIVE:   CHIEF COMPLAINT / HPI:   In person interpreter Claiborne Billings for [place]   Pain all over body:  Patient previously visited 03/02/2022 to clinic for pain all over her body. At that time, she felt like there was sharp pains all over her body that been present for many months.  There were vague complaints of pain rating down the legs noted shaking of her head.  Previously, she noted taking gabapentin on and off but found no relief of her symptoms.  Today she reports, pain is located down her arms and legs. Numbness in the "private area." Patient believes that the pain is hot and tingling. Pain is located in the eyes and neck. Can hear the joints crack when stretching.  No vaginal discharge but anal and vaginal numbness radiating down the legs.   Pain has been present for over one year, has progressed to the entire body.   Hemoptysis presently as another complaint.   Medication Review: Gabapentin, Progesterone, Estradiol, Valsartan, Baclofen, Cymbalta brought into office.   PERTINENT  PMH / PSH:   Past Medical History:  Diagnosis Date   Hypertension     OBJECTIVE:  BP 119/88   Pulse 91   Wt 121 lb (54.9 kg)   LMP 02/23/2022   SpO2 99%   BMI 22.13 kg/m  Physical Exam Vitals reviewed.  Constitutional:      General: She is not in acute distress.    Appearance: Normal appearance. She is not toxic-appearing.  HENT:     Head: Normocephalic.     Nose: Nose normal.     Mouth/Throat:     Mouth: Mucous membranes are moist.  Eyes:     Conjunctiva/sclera: Conjunctivae normal.  Cardiovascular:     Rate and Rhythm: Normal rate and regular rhythm.  Pulmonary:     Effort: Pulmonary effort is normal.     Breath sounds: Normal breath sounds.  Musculoskeletal:     Cervical back: Normal range of motion.  Skin:    Capillary Refill: Capillary refill takes less than 2 seconds.  Neurological:     General: No focal deficit present.     Mental Status: She is alert and oriented to  person, place, and time.     Cranial Nerves: No cranial nerve deficit.     Sensory: No sensory deficit.     Motor: No weakness.  Psychiatric:        Mood and Affect: Mood normal.        Behavior: Behavior normal.      ASSESSMENT/PLAN:  Dysuria Assessment & Plan: Declined vaginal examination, UA negative. Reassess at next visit.   Orders: -     POCT urinalysis dipstick  Nerve pain Assessment & Plan: Ddx broad with recent imaging of lumbar spine and head imaging from 09/2020 that were both unremarkable and reassuring. Full neurologic exam negative without focal deficits. Will check for thyroid dysfunction and inflammatory markers.  Additionally, will get basic lab work with CBC and [place].  Will have her follow up with PCP in 3 weeks. May have psychosomatic component with PHQ9 +, provided therapy options.   Orders: -     TSH Rfx on Abnormal to Free T4 -     CBC -     Comprehensive metabolic panel -     Sedimentation rate -     C-reactive protein  Benign essential HTN Assessment & Plan: Stable on Valsartan     Return in about 3 weeks (around 04/06/2022) for WITH DR  LILLAND . [name], MD 03/16/2022, 10:56 AM PGY-2, [place]

## 2022-03-16 NOTE — Assessment & Plan Note (Signed)
Ddx broad with recent imaging of lumbar spine and head imaging from 09/2020 that were both unremarkable and reassuring. Full neurologic exam negative without focal deficits. Will check for thyroid dysfunction and inflammatory markers.  Additionally, will get basic lab work with CBC and [place].  Will have her follow up with PCP in 3 weeks. May have psychosomatic component with PHQ9 +, provided therapy options.

## 2022-03-16 NOTE — Assessment & Plan Note (Signed)
Stable on Valsartan

## 2022-03-16 NOTE — Assessment & Plan Note (Signed)
Declined vaginal examination, UA negative. Reassess at next visit.

## 2022-03-21 ENCOUNTER — Encounter: Payer: Self-pay | Admitting: Student

## 2022-03-30 ENCOUNTER — Encounter: Payer: 59 | Admitting: Obstetrics and Gynecology

## 2022-04-05 ENCOUNTER — Ambulatory Visit: Payer: 59 | Admitting: Student

## 2022-09-06 ENCOUNTER — Other Ambulatory Visit: Payer: Self-pay | Admitting: Family Medicine

## 2022-09-06 ENCOUNTER — Ambulatory Visit (INDEPENDENT_AMBULATORY_CARE_PROVIDER_SITE_OTHER): Payer: 59 | Admitting: Family Medicine

## 2022-09-06 ENCOUNTER — Encounter: Payer: Self-pay | Admitting: Family Medicine

## 2022-09-06 VITALS — BP 118/70 | HR 72 | Ht 62.0 in | Wt 133.0 lb

## 2022-09-06 DIAGNOSIS — Z0000 Encounter for general adult medical examination without abnormal findings: Secondary | ICD-10-CM | POA: Diagnosis not present

## 2022-09-06 DIAGNOSIS — Z1211 Encounter for screening for malignant neoplasm of colon: Secondary | ICD-10-CM

## 2022-09-06 DIAGNOSIS — M549 Dorsalgia, unspecified: Secondary | ICD-10-CM | POA: Diagnosis not present

## 2022-09-06 DIAGNOSIS — G8929 Other chronic pain: Secondary | ICD-10-CM

## 2022-09-06 DIAGNOSIS — M542 Cervicalgia: Secondary | ICD-10-CM

## 2022-09-06 DIAGNOSIS — M25512 Pain in left shoulder: Secondary | ICD-10-CM

## 2022-09-06 DIAGNOSIS — E7800 Pure hypercholesterolemia, unspecified: Secondary | ICD-10-CM | POA: Diagnosis not present

## 2022-09-06 DIAGNOSIS — Z1231 Encounter for screening mammogram for malignant neoplasm of breast: Secondary | ICD-10-CM

## 2022-09-06 NOTE — Assessment & Plan Note (Signed)
Patient did not follow up with orthopedic office after her MRI last year. She worked some with physical therapy and reports that there has been an improvement in her lower back pain.

## 2022-09-06 NOTE — Progress Notes (Signed)
    SUBJECTIVE:   Chief compliant/HPI: annual examination  Maria [name] is a 52 y.o. who presents today for an annual exam.   Review of systems form notable for neck/shoulder pain. Had MRI in [place] and has degenerative spot in her spine. Has crackling sounds with movement. Mainly on the left side and sometimes radiates down her arm (though also describes radiation down entire left side of body as well - which had been previously described). Was able to go to physical therapy at least once last year   Updated history tabs and problem list   OBJECTIVE:   BP 118/70   Pulse 72   Ht 5\' 2"  (1.575 m)   Wt 133 lb (60.3 kg)   LMP 08/18/2022 (Approximate)   SpO2 99%   BMI 24.33 kg/m   Gen: well-appearing, NAD CV: RRR, no m/r/g appreciated, no peripheral edema Pulm: CTAB, no wheezes/crackles GI: soft, non-tender, non-distended Neck/Back: - Inspection: no gross deformity or asymmetry, swelling or ecchymosis - Palpation: non-TTP over spinous process, hypertonicity of the left trapezius  - ROM: full active ROM of the cervical spine with neck extension, rotation, flexion without pain - Strength: 5/5 wrist flexion, extension, biceps flexion. 5/5 triceps extension.  - Neuro: sensation intact in the C5-C8 nerve root distribution b/l - Special testing: negative slump test, negative spurling's  ASSESSMENT/PLAN:   Pure hypercholesterolemia Lipid panel today  Neck pain on left side Patient reports left neck and shoulder pain, which has been present for a few months. Had MRI in [place] and reports she was told she had DDD. 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 Physical exam is overall reassuring. Discussed that patient can follow-up with orthopedist she was initially sent to regarding this concern but that physical therapy is always the first thing to start.  - Physical therapy referral placed -  Appointment made for patient at ortho office tomorrow.  Chronic bilateral back pain Patient did not follow up with orthopedic office after her MRI last year. She worked some with physical therapy and reports that there has been an improvement in her lower back pain.     Annual Examination  See AVS for age appropriate recommendations.   PHQ score 0, reviewed and discussed. Blood pressure reviewed and at goal.  Asked about intimate partner violence and patient reports no concerns.   Considered the following items based upon USPSTF recommendations: HIV testing:  previously done, patient with no new exposures Hepatitis C:  previously done Syphilis if at high risk:  not at high risk GC/CT not at high risk and not ordered. Lipid panel (nonfasting or fasting) discussed based upon AHA recommendations and ordered.  Consider repeat every 4-6 years.  Reviewed risk factors for latent tuberculosis and not indicated  Discussed family history, BRCA testing not indicated. Tool used to risk stratify was Pedigree Assessment tool   Cervical cancer screening: prior Pap reviewed, repeat due in 2025 Colonoscopy: never done, referral to GI placed  Follow up in 1  year or sooner if indicated.    Evelena Leyden, DO [facility]

## 2022-09-06 NOTE — Patient Instructions (Signed)
Please make sure you go to the orthopedic doctor for evaluation of your neck as well as following up from the MRI you had here.  Please make sure to bring your MRI results from your [place] visit.  I sent in a referral to physical therapy for your neck and shoulder.  I sent in a referral to the GI doctor for colonoscopy to screen for colon cancer.  You are also due for mammogram, please make sure to call the breast center and get this scheduled.

## 2022-09-06 NOTE — Assessment & Plan Note (Signed)
Lipid panel today

## 2022-09-06 NOTE — Assessment & Plan Note (Signed)
Patient reports left neck and shoulder pain, which has been present for a few months. Had MRI in [place] and reports she was told she had DDD. 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 Physical exam is overall reassuring. Discussed that patient can follow-up with orthopedist she was initially sent to regarding this concern but that physical therapy is always the first thing to start.  - Physical therapy referral placed - Appointment made for patient at ortho office tomorrow.

## 2022-09-07 ENCOUNTER — Ambulatory Visit (HOSPITAL_BASED_OUTPATIENT_CLINIC_OR_DEPARTMENT_OTHER): Payer: 59 | Admitting: Orthopaedic Surgery

## 2022-09-07 DIAGNOSIS — M5416 Radiculopathy, lumbar region: Secondary | ICD-10-CM | POA: Diagnosis not present

## 2022-09-07 LAB — LIPID PANEL
Chol/HDL Ratio: 2.8 ratio (ref 0.0–4.4)
Cholesterol, Total: 220 mg/dL — ABNORMAL HIGH (ref 100–199)
HDL: 78 mg/dL (ref >39)
LDL Chol Calc (NIH): 122 mg/dL — ABNORMAL HIGH (ref 0–99)
Triglycerides: 117 mg/dL (ref 0–149)
VLDL Cholesterol Cal: 20 mg/dL (ref 5–40)

## 2022-09-07 NOTE — Progress Notes (Signed)
Chief Complaint: Lower back pain upper back pain     History of Present Illness:   09/07/2022: Resents today for further discussion of her radiating pain down bilateral lower extremities and through the upper neck and arms.  She did recently go to [place] where she did have an MRI of essentially her entire spine and brain performed.  He is here today for further discussion.  Maria [name] is a 52 y.o. female presents with several years of lower back pain and upper back pain.  She states for the last several months she feels like this is radiating to the back neck as well as to the lumbar spine.  She has been seeing her primary care doctor.  She is also developed sharp shooting pain down the entirety of the left leg over the course of the last several months as well.  Her primary care doctor has trialed multiple treatments including gabapentin and anti-inflammatories such as naproxen.  None of these worked.  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    Surgical History:   None  PMH/PSH/Family History/Social History/Meds/Allergies:    Past Medical History:  Diagnosis Date   Hypertension    Past Surgical History:  Procedure Laterality Date   RHINOPLASTY     Social History   Socioeconomic History   Marital status: Divorced    Spouse name: Not on file   Number of children: Not on file   Years of education: Not on file   Highest education level: Not on file  Occupational History   Not on file  Tobacco Use   Smoking status: Never   Smokeless tobacco: Never  Vaping Use   Vaping Use: Never used  Substance and Sexual Activity   Alcohol use: Not Currently    Comment: occasionally   Drug use: No   Sexual activity: Yes    Partners: Male    Birth control/protection: Condom    Comment: married  Other Topics Concern   Not on file  Social History Narrative     Marital status: Married      Children:  2  children (18, 50)      Lives: with 2 children.      Employment:  Tree surgeon; full time      Tobacco: none      Alcohol: none      Drugs: none      Exercise: none   Social Determinants of Corporate investment banker Strain: Not on file  Food Insecurity: Not on file  Transportation Needs: Not on file  Physical Activity: Not on file  Stress: Not on file  Social Connections: Not on file   Family History  Problem Relation Age of Onset   Hypertension Father    No Known Allergies Current Outpatient Medications  Medication Sig Dispense Refill   DULoxetine (CYMBALTA) 30 MG capsule TAKE 1 CAPSULE (30 MG TOTAL) BY MOUTH DAILY. THEN CAN TAKE 60 MG DAILY AFTER ONE WEEK OF 30 MG 180 capsule 1   estradiol (ESTRACE) 0.5 MG tablet TAKE 1 TABLET BY MOUTH EVERY DAY 90 tablet 1   gabapentin (NEURONTIN) 300 MG capsule Take 1 capsule (300 mg total) by mouth at bedtime. 90 capsule 3   progesterone (PROMETRIUM) 200 MG capsule Take  one tablet daily for the first 12 days of each month 12 capsule 2   valsartan (DIOVAN) 80 MG tablet Take 1 tablet (80 mg total) by mouth daily. 90 tablet 3   No current facility-administered medications for this visit.   No results found.  Review of Systems:   A ROS was performed including pertinent positives and negatives as documented in the HPI.  Physical Exam :   Constitutional: NAD and appears stated age Neurological: Alert and oriented Psych: Appropriate affect and cooperative Last menstrual period 08/18/2022.   Comprehensive Musculoskeletal Exam:    Tenderness palpation about the lumbar spine.  Positive straight leg raise on the left.  Overall distally neurovascularly intact with regard to the left leg.  Reflexes are symmetric  Imaging:     I personally reviewed and interpreted the radiographs.   Assessment:   52 y.o. female with symptoms of shooting leg pain and back pain now for many years.  MRI was reviewed in person and does not show any evidence of  any type impingement.  At this point I do believe that she may benefit more from a neurology referral as her cervical/thoracic/lumbar MRI were all completely within normal limits.  I do believe this may be more of a fibromyalgia type pain for which I do believe that she may benefit from a neurological assessment.  It is also possible that this may be some type of demyelinating condition although I would like her to be assessed for this Plan :    -Return to clinic as needed     I personally saw and evaluated the patient, and participated in the management and treatment plan.  Huel Cote, MD Attending Physician, Orthopedic Surgery  This document was dictated using Dragon voice recognition software. A reasonable attempt at proof reading has been made to minimize errors.

## 2022-09-09 ENCOUNTER — Ambulatory Visit: Payer: 59

## 2022-09-26 ENCOUNTER — Ambulatory Visit: Payer: 59

## 2022-09-26 ENCOUNTER — Ambulatory Visit
Admission: RE | Admit: 2022-09-26 | Discharge: 2022-09-26 | Disposition: A | Discharge status: Home / Self Care | Payer: 59 | Source: Ambulatory Visit | Attending: Family Medicine | Admitting: Family Medicine

## 2022-09-26 DIAGNOSIS — Z1231 Encounter for screening mammogram for malignant neoplasm of breast: Secondary | ICD-10-CM

## 2022-09-27 ENCOUNTER — Encounter: Payer: Self-pay | Admitting: Neurology

## 2022-10-07 ENCOUNTER — Other Ambulatory Visit: Payer: Self-pay | Admitting: Family Medicine

## 2022-10-07 DIAGNOSIS — R232 Flushing: Secondary | ICD-10-CM

## 2022-10-25 NOTE — Progress Notes (Signed)
Initial neurology clinic note  Reason for Evaluation: Consultation requested by Huel Cote, MD for an opinion regarding neck pain radiating into left arm and leg. My final recommendations will be communicated back to the requesting physician by way of shared medical record or letter to requesting physician via [place] mail.  HPI: This is [name], a 52 y.o. female with a medical history of HTN, HLD, vit D deficiency who presents to neurology clinic with the chief complaint of neck pain radiating into left arm and leg. The patient is accompanied by translator, who helps with history as patient is [place] speaking.  Patient has been seen by PCP who referred to orthopedics for pain, originally in the back, then in neck into shoulder into left arm and leg. There is some radiating into the right shoulder, but not the right limbs. She describes the pain as a stabbing pain. It can come and go. The low back pain has improved. Bending the neck makes the pain worse. It has been going on for over a year (maybe several years). She rates the pain as 6-7/10. She has tried anti-inflammatories and gabapentin without relief. She did take some medication prescribed by [place] doctors that she does not know the name of that did help some. She has had MRI of entire spine (c/t/l) per records (which I cannot see all of), without obvious pathology to explain symptoms.  Patient was seen by Dr. Steward Drone in ortho on 09/07/22. Per that clinic note: 52 y.o. female with symptoms of shooting leg pain and back pain now for many years.  MRI was reviewed in person and does not show any evidence of any type impingement.  At this point I do believe that she may benefit more from a neurology referral as her cervical/thoracic/lumbar MRI were all completely within normal limits.  I do believe this may be more of a fibromyalgia type pain for which I do believe that she may benefit from a neurological assessment.  It is also  possible that this may be some type of demyelinating condition although I would like her to be assessed for this.  Patient has been prescribed gabapentin 300 mg at bedtime and cymbalta 60 mg. Patient states she does not think she is on them, but there is great confusion about what she is taking. She does not know if she ever took Cymbalta. She does not take vit D currently (did in the past).  The patient has not noticed any recent skin rashes nor does she report any constitutional symptoms like fever, night sweats, anorexia or unintentional weight loss.  EtOH use: None  Restrictive diet? No Family history of neuropathy/myopathy/NM disease? No   MEDICATIONS:  Outpatient Encounter Medications as of 11/02/2022  Medication Sig   estradiol (ESTRACE) 0.5 MG tablet TAKE 1 TABLET BY MOUTH EVERY DAY   valsartan (DIOVAN) 80 MG tablet Take 1 tablet (80 mg total) by mouth daily.   DULoxetine (CYMBALTA) 30 MG capsule TAKE 1 CAPSULE (30 MG TOTAL) BY MOUTH DAILY. THEN CAN TAKE 60 MG DAILY AFTER ONE WEEK OF 30 MG (Patient not taking: Reported on 11/02/2022)   gabapentin (NEURONTIN) 300 MG capsule Take 1 capsule (300 mg total) by mouth at bedtime. (Patient not taking: Reported on 11/02/2022)   progesterone (PROMETRIUM) 200 MG capsule Take one tablet daily for the first 12 days of each month (Patient not taking: Reported on 11/02/2022)   No facility-administered encounter medications on file as of 11/02/2022.    PAST  MEDICAL HISTORY: Past Medical History:  Diagnosis Date   Hypertension     PAST SURGICAL HISTORY: Past Surgical History:  Procedure Laterality Date   RHINOPLASTY      ALLERGIES: No Known Allergies  FAMILY HISTORY: Family History  Problem Relation Age of Onset   Hypertension Father     SOCIAL HISTORY: Social History   Tobacco Use   Smoking status: Never   Smokeless tobacco: Never  Vaping Use   Vaping Use: Never used  Substance Use Topics   Alcohol use: Not Currently     Comment: occasionally   Drug use: No   Social History   Social History Narrative     Marital status: Married      Children:  2 children (18, 1)      Lives: with 2 children.      Employment:  Tree surgeon; full time      Tobacco: none      Alcohol: none      Drugs: none      Exercise: none     OBJECTIVE: PHYSICAL EXAM: BP 131/85   Pulse 81   Ht 5\' 2"  (1.575 m)   Wt 132 lb 12.8 oz (60.2 kg)   SpO2 97%   BMI 24.29 kg/m   General: General appearance: Awake and alert. No distress. Cooperative with exam.  Skin: No obvious rash or jaundice. HEENT: Atraumatic. Anicteric. Lungs: Non-labored breathing on room air  Extremities: No edema. Deformity of left index finger (DIP) Musculoskeletal: No obvious joint swelling. No pain to palpation of joints. Psych: Affect appropriate.  Neurological: Mental Status: Alert. Speech fluent. No pseudobulbar affect Cranial Nerves: CNII: No RAPD. Visual fields grossly intact. CNIII, IV, VI: PERRL. No nystagmus. EOMI. CN V: Facial sensation intact bilaterally to fine touch. CN VII: Facial muscles symmetric and strong. No ptosis at rest. CN VIII: Hearing grossly intact bilaterally. CN IX: No hypophonia. CN X: Palate elevates symmetrically. CN XI: Full strength shoulder shrug bilaterally. CN XII: Tongue protrusion full and midline. No atrophy or fasciculations. No significant dysarthria Motor: Tone is normal. No fasciculations in any extremities. No atrophy.  Individual muscle group testing (MRC grade out of 5):  Movement     Neck flexion 5    Neck extension 5     Right Left   Shoulder abduction 5 5   Shoulder adduction 5 5   Elbow flexion 5 5   Elbow extension 5 5   Wrist extension 5 5   Wrist flexion 5 5   Finger abduction - FDI 5 5   Finger abduction - ADM 5 5   Finger extension 5 5   Finger distal flexion - 2/3 5 5    Finger distal flexion - 4/5 5 5    Thumb flexion - FPL 5 5   Thumb abduction - APB 5 5    Hip flexion 5 5    Hip extension 5 5   Hip adduction 5 5   Hip abduction 5 5   Knee extension 5 5   Knee flexion 5 5   Dorsiflexion 5 5   Plantarflexion 5 5     Reflexes:  Right Left   Bicep 1+ 1+   Tricep 1+ 1+   BrRad 1+ 1+   Knee 1+ 1+   Ankle 1+ 1+    Pathological Reflexes: Babinski: flexor response bilaterally Hoffman: absent bilaterally Troemner: absent bilaterally Sensation: Pinprick: Intact in all extremities Vibration: Intact in all extremities Proprioception: Intact in bilateral great toes Coordination: Intact  finger-to- nose-finger bilaterally. Romberg negative. Gait: Able to rise from chair with arms crossed unassisted. Normal, narrow-based gait.  Lab and Test Review: Internal labs: Lipid panel: Component     Latest Ref Rng 09/06/2022  Cholesterol, Total     100 - 199 mg/dL 161 (H)   Triglycerides     0 - 149 mg/dL 096   HDL Cholesterol     >39 mg/dL 78   VLDL Cholesterol Cal     5 - 40 mg/dL 20   LDL Chol Calc (NIH)     0 - 99 mg/dL 045 (H)   Total CHOL/HDL Ratio     0.0 - 4.4 ratio 2.8     TSH (10/19/21): 1.100 BMP (10/19/21): wnl Ferritin (08/10/21): 80 CBC (08/10/21): MCV of 98 Vit D (04/21/21): 25.7 HbA1c (04/07/20): 5.3  Imaging: MRI lumbar spine wo contrast (12/23/21): FINDINGS: Mild intermittent motion degradation.   Segmentation: 5 lumbar vertebrae. The caudal most well-formed intervertebral disc space is designated L5-S1.   Alignment: Mild levocurvature of the lumbar spine. No significant spondylolisthesis.   Vertebrae: Vertebral body height is maintained. Tiny Schmorl node within the T12 inferior endplate. No significant marrow edema or focal suspicious osseous lesion.   Conus medullaris and cauda equina: Conus extends to the L2 level. No signal abnormality identified within the visualized distal spinal cord.   Paraspinal and other soft tissues: Incompletely imaged uterine fibroid, measuring at least 8.6 cm. No paraspinal mass or collection.    Disc levels:   No more than mild disc degeneration at the lumbar or visualized lower thoracic levels.   T11-T12: This level is imaged in the sagittal plane only. Slight disc bulge. No significant spinal canal or foraminal stenosis.   T12-L1: Slight disc bulge. No significant spinal canal or foraminal stenosis.   L1-L2: Slight disc bulge. No significant spinal canal or foraminal stenosis.   L2-L3: Slight disc bulge. No significant spinal canal or foraminal stenosis.   L3-L4: Slight disc bulge. Mild facet hypertrophy. No significant spinal canal or foraminal stenosis.   L4-L5: Slight disc bulge. Mild facet and ligamentum flavum hypertrophy. No significant spinal canal or foraminal stenosis.   L5-S1: No significant disc herniation or stenosis.   IMPRESSION: Lumbar spondylosis, as outlined. No significant spinal canal or foraminal stenosis. No more than mild disc degeneration.   Mild levocurvature of the lumbar spine.   Partially imaged uterine fibroid, measuring at least 8.6 cm.  ASSESSMENT: Maria [name] is a 52 y.o. female who presents for evaluation of neck, back, left arm, and left leg pain. She has a relevant medical history of HTN, HLD, vit D deficiency. Her neurological examination is essentially normal today. Available diagnostic data is significant for MRI of entire spine (C/T/L) that is reportedly without significant stenosis. The etiology of her pain is currently unclear and may be due to non-neurologic cause such as arthritis or fibromyalgia or other chronic pain syndrome. The language barrier can be difficult in both the history and examination though, so I will get an EMG to look for a peripheral process and lab work to look for other treatable causes.  PLAN: -Blood work: B12, folate, vit D -EMG: Left arm and leg  -Return to clinic to be determined  The impression above as well as the plan as outlined below were extensively discussed with the patient (in the  company of translator) who voiced understanding. All questions were answered to their satisfaction.  When available, results of the above investigations and possible further recommendations  will be communicated to the patient via telephone/MyChart. Patient to call office if not contacted after expected testing turnaround time.   Total time spent reviewing records, interview, history/exam, documentation, and coordination of care on day of encounter:  45 min   Thank you for allowing me to participate in patient's care.  If I can answer any additional questions, I would be pleased to do so.  Jacquelyne Balint, MD   CC: Patient, No Pcp Per No address on file  CC: Referring provider: Huel Cote, MD [address] [place],  [place] [address]

## 2022-11-02 ENCOUNTER — Other Ambulatory Visit (INDEPENDENT_AMBULATORY_CARE_PROVIDER_SITE_OTHER): Payer: 59

## 2022-11-02 ENCOUNTER — Encounter: Payer: Self-pay | Admitting: Neurology

## 2022-11-02 ENCOUNTER — Ambulatory Visit: Payer: 59 | Admitting: Neurology

## 2022-11-02 VITALS — BP 131/85 | HR 81 | Ht 62.0 in | Wt 132.8 lb

## 2022-11-02 DIAGNOSIS — M542 Cervicalgia: Secondary | ICD-10-CM | POA: Diagnosis not present

## 2022-11-02 DIAGNOSIS — G8929 Other chronic pain: Secondary | ICD-10-CM | POA: Diagnosis not present

## 2022-11-02 DIAGNOSIS — M79605 Pain in left leg: Secondary | ICD-10-CM | POA: Diagnosis not present

## 2022-11-02 DIAGNOSIS — M545 Low back pain, unspecified: Secondary | ICD-10-CM | POA: Diagnosis not present

## 2022-11-02 DIAGNOSIS — M79602 Pain in left arm: Secondary | ICD-10-CM | POA: Diagnosis not present

## 2022-11-02 LAB — VITAMIN D 25 HYDROXY (VIT D DEFICIENCY, FRACTURES): VITD: 29.95 ng/mL — ABNORMAL LOW (ref 30.00–100.00)

## 2022-11-02 LAB — FOLATE: Folate: 15 ng/mL (ref >5.9)

## 2022-11-02 LAB — VITAMIN B12: Vitamin B-12: 834 pg/mL (ref 211–911)

## 2022-11-02 NOTE — Patient Instructions (Addendum)
I would like to investigate your symptoms further with the following: -Blood work today -Muscle and nerve test called EMG (see more information below)  I will be in touch after I have your results to discuss your results.  The physicians and staff at [facility] are committed to providing excellent care. You may receive a survey requesting feedback about your experience at our office. We strive to receive "very good" responses to the survey questions. If you feel that your experience would prevent you from giving the office a "very good " response, please contact our office to try to remedy the situation. We may be reached at [phone]. Thank you for taking the time out of your busy day to complete the survey.  Jacquelyne Balint, MD [facility]  ELECTROMYOGRAM AND NERVE CONDUCTION STUDIES (EMG/NCS) INSTRUCTIONS  How to Prepare The neurologist conducting the EMG will need to know if you have certain medical conditions. Tell the neurologist and other EMG lab personnel if you: Have a pacemaker or any other electrical medical device Take blood-thinning medications Have hemophilia, a blood-clotting disorder that causes prolonged bleeding Bathing Take a shower or bath shortly before your exam in order to remove oils from your skin. Don't apply lotions or creams before the exam.  What to Expect You'll likely be asked to change into a hospital gown for the procedure and lie down on an examination table. The following explanations can help you understand what will happen during the exam.  Electrodes. The neurologist or a technician places surface electrodes at various locations on your skin depending on where you're experiencing symptoms. Or the neurologist may insert needle electrodes at different sites depending on your symptoms.  Sensations. The electrodes will at times transmit a tiny electrical current that you may feel as a twinge or spasm. The needle electrode may cause discomfort or  pain that usually ends shortly after the needle is removed. If you are concerned about discomfort or pain, you may want to talk to the neurologist about taking a short break during the exam.  Instructions. During the needle EMG, the neurologist will assess whether there is any spontaneous electrical activity when the muscle is at rest - activity that isn't present in healthy muscle tissue - and the degree of activity when you slightly contract the muscle.  He or she will give you instructions on resting and contracting a muscle at appropriate times. Depending on what muscles and nerves the neurologist is examining, he or she may ask you to change positions during the exam.  After your EMG You may experience some temporary, minor bruising where the needle electrode was inserted into your muscle. This bruising should fade within several days. If it persists, contact your primary care doctor.

## 2022-11-08 ENCOUNTER — Ambulatory Visit: Payer: 59 | Admitting: Neurology

## 2022-11-08 DIAGNOSIS — M545 Low back pain, unspecified: Secondary | ICD-10-CM

## 2022-11-08 DIAGNOSIS — M542 Cervicalgia: Secondary | ICD-10-CM

## 2022-11-08 DIAGNOSIS — M79605 Pain in left leg: Secondary | ICD-10-CM

## 2022-11-08 DIAGNOSIS — M79602 Pain in left arm: Secondary | ICD-10-CM

## 2022-11-08 NOTE — Progress Notes (Signed)
Can we let patient know the results of her EMG? It was normal in the arm and leg, with no evidence of nerve damage, including pinched nerves in the neck or back. I would recommend she discuss with her PCP as this may be a chronic pain syndrome such as fibromyalgia.  She will need a [place]. Thank you.  Jacquelyne Balint, MD

## 2022-11-08 NOTE — Procedures (Signed)
[facility]  [address], [address]  [place], [place] [address] Tel: [phone] Fax: [phone] Test Date:  11/08/2022  Patient: Maria [name] DOB: 1971-02-23 Physician: Jacquelyne Balint, MD  Sex: Female Height: 5\' 2"  Ref Phys: Jacquelyne Balint, MD  ID#: [id number]   Technician:    History: This is a 52 year old female with diffuse pain, mostly in left arm and leg.  NCV & EMG Findings: Extensive electrodiagnostic evaluation of the left upper and lower limbs shows: Left sural, superficial peroneal/fibular, median, ulnar, and radial sensory responses are within normal limits. Left peroneal/fibular (EDB), tibial (AH), median (APB), and ulnar (ADM) motor responses are within normal limits. Left H reflex latency is within normal limits. There is no evidence of active or chronic motor axon loss changes affecting any of the tested muscles on needle examination. Motor unit configuration and recruitment pattern is within normal limits.  Impression: This is a normal study. Specifically: No electrodiagnostic evidence of a left cervical (C5-C8) or left lumbosacral (L3-S1) motor radiculopathy. No electrodiagnostic evidence of a large fiber sensorimotor neuropathy or myopathy. No electrodiagnostic evidence of a left median mononeuropathy at or distal to the wrist (ie: carpal tunnel syndrome).    ___________________________ Jacquelyne Balint, MD    Nerve Conduction Studies Motor Nerve Results    Latency Amplitude F-Lat Segment Distance CV Comment  Site (ms) Norm (mV) Norm (ms)  (cm) (m/s) Norm   Left Fibular (EDB) Motor  Ankle 2.3  < 6.0 4.8  > 2.5        Bel fib head 8.3 - 4.8 -  Bel fib head-Ankle 29 48  > 40   Pop fossa 9.9 - 4.4 -  Pop fossa-Bel fib head 8 50 -   Left Median (APB) Motor  Wrist 2.3  < 4.0 12.3  > 6.0        Elbow 6.8 - 11.4 -  Elbow-Wrist 27 60  > 50   Left Tibial (AH) Motor  Ankle 2.8  < 6.0 19.7  > 4.0        Knee 10.2 - 17.3 -  Knee-Ankle 35 47  > 40   Left  Ulnar (ADM) Motor  Wrist 1.55  < 3.1 8.1  > 7.0        Bel elbow 4.9 - 8.0 -  Bel elbow-Wrist 20 59  > 50   Ab elbow 6.6 - 8.0 -  Ab elbow-Bel elbow 10 59 -    Sensory Sites    Neg Peak Lat Amplitude (O-P) Segment Distance Velocity Comment  Site (ms) Norm (V) Norm  (cm) (ms)   Left Median Sensory  Wrist-Dig II 2.8  < 3.6 64  > 15 Wrist-Dig II 13    Left Radial Sensory  Forearm-Wrist 1.90  < 2.7 62  > 14 Forearm-Wrist 10    Left Superficial Fibular Sensory  14 cm-Ankle 2.4  < 4.6 8  > 4 14 cm-Ankle 14    Left Sural Sensory  Calf-Lat mall 3.5  < 4.6 13  > 4 Calf-Lat mall 14    Left Ulnar Sensory  Wrist-Dig V 2.6  < 3.1 50  > 10 Wrist-Dig V 11     H-Reflex Results    M-Lat H Lat H Neg Amp H-M Lat  Site (ms) (ms) Norm (mV) (ms)  Left Tibial H-Reflex  Pop fossa 4.9 26.4  < 35.0 1.46 21.5   Electromyography   Side Muscle Ins.Act Fibs Fasc Recrt Amp Dur Poly Activation Comment  Left FDI Nml Nml Nml Nml Nml Nml Nml Nml N/A  Left EIP Nml Nml Nml Nml Nml Nml Nml Nml N/A  Left Pronator teres Nml Nml Nml Nml Nml Nml Nml Nml N/A  Left Biceps Nml Nml Nml Nml Nml Nml Nml Nml N/A  Left Triceps lat hd Nml Nml Nml Nml Nml Nml Nml Nml N/A  Left Deltoid Nml Nml Nml Nml Nml Nml Nml Nml N/A  Left Tib ant Nml Nml Nml Nml Nml Nml Nml Nml N/A  Left Gastroc MH Nml Nml Nml Nml Nml Nml Nml Nml N/A  Left Rectus fem Nml Nml Nml Nml Nml Nml Nml Nml N/A  Left FDL Nml Nml Nml Nml Nml Nml Nml Nml N/A  Left Biceps fem SH Nml Nml Nml Nml Nml Nml Nml Nml N/A      Waveforms:  Motor           Sensory             H-Reflex

## 2022-12-02 DIAGNOSIS — M545 Low back pain, unspecified: Secondary | ICD-10-CM | POA: Diagnosis not present

## 2023-03-31 ENCOUNTER — Other Ambulatory Visit: Payer: Self-pay

## 2023-03-31 DIAGNOSIS — I10 Essential (primary) hypertension: Secondary | ICD-10-CM

## 2023-04-01 ENCOUNTER — Ambulatory Visit
Admission: RE | Admit: 2023-04-01 | Discharge: 2023-04-01 | Disposition: A | Discharge status: Home / Self Care | Payer: Self-pay | Source: Ambulatory Visit | Attending: Family Medicine | Admitting: Family Medicine

## 2023-04-01 VITALS — BP 126/78 | HR 73 | Temp 99.0°F | Resp 20

## 2023-04-01 DIAGNOSIS — I10 Essential (primary) hypertension: Secondary | ICD-10-CM | POA: Diagnosis not present

## 2023-04-01 DIAGNOSIS — Z760 Encounter for issue of repeat prescription: Secondary | ICD-10-CM | POA: Diagnosis not present

## 2023-04-01 MED ORDER — VALSARTAN 80 MG PO TABS
80.0000 mg | ORAL_TABLET | Freq: Every day | ORAL | 0 refills | Status: AC
Start: 2023-04-01 — End: ?

## 2023-04-01 NOTE — ED Triage Notes (Addendum)
Pt requesting med refill valsartan 80mg  daily-states she needs 3 months due to traveling to [facility] gait-video interpreter/[place] used for triage

## 2023-04-01 NOTE — ED Provider Notes (Signed)
[facility]  Note:  This document was prepared using Conservation officer, historic buildings and may include unintentional dictation errors.  MRN: [id number] DOB: 09-20-1970  Subjective:   Maria [name] is a 52 y.o. female presenting for medication refill for her valsartan.  Takes 80 mg once daily.  She will be traveling for 3 months to [place].  She is stable with her blood pressure medications.  No chest pain, headaches, confusion, weakness, numbness or tingling, nausea, vomiting, abdominal pain, hematuria.  No current facility-administered medications for this encounter.  Current Outpatient Medications:    DULoxetine (CYMBALTA) 30 MG capsule, TAKE 1 CAPSULE (30 MG TOTAL) BY MOUTH DAILY. THEN CAN TAKE 60 MG DAILY AFTER ONE WEEK OF 30 MG (Patient not taking: Reported on 11/02/2022), Disp: 180 capsule, Rfl: 1   estradiol (ESTRACE) 0.5 MG tablet, TAKE 1 TABLET BY MOUTH EVERY DAY, Disp: 90 tablet, Rfl: 2   gabapentin (NEURONTIN) 300 MG capsule, Take 1 capsule (300 mg total) by mouth at bedtime. (Patient not taking: Reported on 11/02/2022), Disp: 90 capsule, Rfl: 3   progesterone (PROMETRIUM) 200 MG capsule, Take one tablet daily for the first 12 days of each month (Patient not taking: Reported on 11/02/2022), Disp: 12 capsule, Rfl: 2   valsartan (DIOVAN) 80 MG tablet, Take 1 tablet (80 mg total) by mouth daily., Disp: 90 tablet, Rfl: 3   No Known Allergies  Past Medical History:  Diagnosis Date   Hypertension      Past Surgical History:  Procedure Laterality Date   RHINOPLASTY      Family History  Problem Relation Age of Onset   Hypertension Father     Social History   Tobacco Use   Smoking status: Never   Smokeless tobacco: Never  Vaping Use   Vaping status: Never Used  Substance Use Topics   Alcohol use: Not Currently    Comment: occasionally   Drug use: No    ROS   Objective:   Vitals: BP 126/78 (BP Location: Right Arm)   Pulse 73   Temp 99 F  (37.2 C) (Oral)   Resp 20   SpO2 95%   Physical Exam Constitutional:      General: She is not in acute distress.    Appearance: Normal appearance. She is well-developed. She is not ill-appearing, toxic-appearing or diaphoretic.  HENT:     Head: Normocephalic and atraumatic.     Nose: Nose normal.     Mouth/Throat:     Mouth: Mucous membranes are moist.  Eyes:     General: No scleral icterus.       Right eye: No discharge.        Left eye: No discharge.     Extraocular Movements: Extraocular movements intact.  Neck:     Vascular: No carotid bruit.  Cardiovascular:     Rate and Rhythm: Normal rate and regular rhythm.     Heart sounds: Normal heart sounds. No murmur heard.    No friction rub. No gallop.  Pulmonary:     Effort: Pulmonary effort is normal. No respiratory distress.     Breath sounds: No stridor. No wheezing, rhonchi or rales.  Chest:     Chest wall: No tenderness.  Skin:    General: Skin is warm and dry.  Neurological:     General: No focal deficit present.     Mental Status: She is alert and oriented to person, place, and time.  Psychiatric:  Mood and Affect: Mood normal.        Behavior: Behavior normal.     Assessment and Plan :   PDMP not reviewed this encounter.  1. Encounter for medication refill   2. Essential hypertension    Medication refill provided.    Wallis Bamberg, PA-C 04/01/23 1051

## 2024-07-01 ENCOUNTER — Ambulatory Visit: Admitting: Internal Medicine
